# Patient Record
Sex: Male | Born: 1975
Health system: Southern US, Community
[De-identification: ages and names within clinical notes are randomized; demographics above are authoritative.]

## PROBLEM LIST (undated history)

## (undated) DIAGNOSIS — E669 Obesity, unspecified: Secondary | ICD-10-CM

## (undated) DIAGNOSIS — I10 Essential (primary) hypertension: Secondary | ICD-10-CM

## (undated) DIAGNOSIS — E785 Hyperlipidemia, unspecified: Secondary | ICD-10-CM

## (undated) HISTORY — PX: MOUTH SURGERY: SHX715

---

## 2017-10-16 DIAGNOSIS — J302 Other seasonal allergic rhinitis: Secondary | ICD-10-CM | POA: Insufficient documentation

## 2018-04-27 ENCOUNTER — Emergency Department (INDEPENDENT_AMBULATORY_CARE_PROVIDER_SITE_OTHER): Payer: Managed Care, Other (non HMO)

## 2018-04-27 ENCOUNTER — Emergency Department (INDEPENDENT_AMBULATORY_CARE_PROVIDER_SITE_OTHER)
Admission: EM | Admit: 2018-04-27 | Discharge: 2018-04-27 | Disposition: A | Discharge status: Home / Self Care | Payer: Managed Care, Other (non HMO) | Source: Home / Self Care | Attending: Family Medicine | Admitting: Family Medicine

## 2018-04-27 ENCOUNTER — Other Ambulatory Visit: Payer: Self-pay

## 2018-04-27 ENCOUNTER — Encounter: Payer: Self-pay | Admitting: Emergency Medicine

## 2018-04-27 DIAGNOSIS — R0602 Shortness of breath: Secondary | ICD-10-CM

## 2018-04-27 DIAGNOSIS — J029 Acute pharyngitis, unspecified: Secondary | ICD-10-CM

## 2018-04-27 DIAGNOSIS — R221 Localized swelling, mass and lump, neck: Secondary | ICD-10-CM

## 2018-04-27 DIAGNOSIS — R0981 Nasal congestion: Secondary | ICD-10-CM | POA: Diagnosis not present

## 2018-04-27 HISTORY — DX: Essential (primary) hypertension: I10

## 2018-04-27 MED ORDER — IPRATROPIUM BROMIDE 0.06 % NA SOLN: 2.0000 | Freq: Four times a day (QID) | NASAL | 1 refills | Status: DC

## 2018-04-27 MED ORDER — PREDNISONE 20 MG PO TABS: ORAL_TABLET | ORAL | 0 refills | Status: DC

## 2018-04-27 MED ORDER — CETIRIZINE HCL 10 MG PO TABS: 10.0000 mg | ORAL_TABLET | Freq: Every day | ORAL | 1 refills | Status: DC

## 2018-04-27 NOTE — ED Triage Notes (Signed)
Pt c/o of shortness of breath. States it's been present for about two weeks but has worsened. He had to leave work early today because he was unable to complete full sentences. He denies any other symptoms

## 2018-04-27 NOTE — ED Provider Notes (Signed)
[facility]    CSN: [id number] Arrival date & time: 04/27/18  1545     History   Chief Complaint Chief Complaint  Patient presents with  . Shortness of Breath    HPI Gregory Clark is a 42 y.o. male.   HPI  Gregory Clark is a 42 y.o. male presenting to UC with c/o intermittent SOB that has been going on for about 1 weeks now, worsened today while he was at work.  He had to leave work early because he was unable to fully complete a sentence. Per PMH pt was seen at the ED on 04/21/18 and dx with pneumonia and anxiety. He did complete the course of antibiotics and f/u with his PCP but pt does not believe the symptoms are from pneumonia or his anxiety. He states it feels like he cannot breath through his nose due to congestion but it also feels like hsi throat closes up at times. He feels like the Left side of his throat is sore and swollen. Denies fever, chills, n/v/d. He denies known thyroid problems but does not believe his PCP ever checked his thyroid. He does have f/u with ENT but not for another 3-4 weeks. Denies chest pain or SOB at this time.    Past Medical History:  Diagnosis Date  . Hypertension     There are no active problems to display for this patient.   Past Surgical History:  Procedure Laterality Date  . MOUTH SURGERY     plate in jaw       Home Medications    Prior to Admission medications   Medication Sig Start Date End Date Taking? Authorizing Provider  cetirizine (ZYRTEC) 10 MG tablet Take 1 tablet (10 mg total) by mouth daily. 04/27/18   Lurene Shadow, PA-C  ipratropium (ATROVENT) 0.06 % nasal spray Place 2 sprays into both nostrils 4 (four) times daily. 04/27/18   Lurene Shadow, PA-C  predniSONE (DELTASONE) 20 MG tablet 3 tabs po day one, then 2 po daily x 4 days 04/27/18   Lurene Shadow, PA-C    Family History History reviewed. No pertinent family history.  Social History Social History   Tobacco Use  . Smoking status: Never Smoker    . Smokeless tobacco: Never Used  Substance Use Topics  . Alcohol use: Not Currently  . Drug use: Not Currently     Allergies   Patient has no known allergies.   Review of Systems Review of Systems  Constitutional: Negative for chills and fever.  HENT: Positive for congestion, postnasal drip, rhinorrhea and sore throat. Negative for ear pain, trouble swallowing and voice change.   Respiratory: Positive for shortness of breath. Negative for cough.   Cardiovascular: Negative for chest pain and palpitations.  Gastrointestinal: Negative for abdominal pain, diarrhea, nausea and vomiting.  Musculoskeletal: Negative for arthralgias, back pain and myalgias.  Skin: Negative for rash.     Physical Exam Triage Vital Signs ED Triage Vitals [04/27/18 1613]  Enc Vitals Group     BP 121/80     Pulse Rate 76     Resp      Temp 98.7 F (37.1 C)     Temp Source Oral     SpO2 99 %     Weight 258 lb (117 kg)     Height 5\' 9"  (1.753 m)     Head Circumference      Peak Flow      Pain Score 0  Pain Loc      Pain Edu?      Excl. in GC?    No data found.  Updated Vital Signs BP 121/80 (BP Location: Right Arm)   Pulse 76   Temp 98.7 F (37.1 C) (Oral)   Ht 5\' 9"  (1.753 m)   Wt 258 lb (117 kg)   SpO2 99%   BMI 38.10 kg/m   Visual Acuity Right Eye Distance:   Left Eye Distance:   Bilateral Distance:    Right Eye Near:   Left Eye Near:    Bilateral Near:     Physical Exam  Constitutional: He is oriented to person, place, and time. He appears well-developed and well-nourished.  Non-toxic appearance. He does not appear ill. No distress.  HENT:  Head: Normocephalic and atraumatic.  Right Ear: Tympanic membrane normal.  Left Ear: Tympanic membrane normal.  Nose: Mucosal edema present. Right sinus exhibits no maxillary sinus tenderness and no frontal sinus tenderness. Left sinus exhibits no maxillary sinus tenderness and no frontal sinus tenderness.  Mouth/Throat: Uvula is  midline, oropharynx is clear and moist and mucous membranes are normal.  Eyes: EOM are normal.  Neck: Normal range of motion. Neck supple. No JVD present. No tracheal deviation present. No thyroid mass and no thyromegaly present.  Cardiovascular: Normal rate and regular rhythm.  Pulmonary/Chest: Effort normal and breath sounds normal. He has no decreased breath sounds. He has no wheezes. He has no rhonchi. He has no rales.  Musculoskeletal: Normal range of motion.  Lymphadenopathy:    He has cervical adenopathy (Left side, tender).  Neurological: He is alert and oriented to person, place, and time.  Skin: Skin is warm and dry.  Psychiatric: He has a normal mood and affect. His behavior is normal.  Nursing note and vitals reviewed.    UC Treatments / Results  Labs (all labs ordered are listed, but only abnormal results are displayed) Labs Reviewed  TSH  POCT CBC W AUTO DIFF ([facility])    EKG None  Radiology US Soft Tissue Head & Neck (non-thyroid)  Result Date: 04/30/2018 CLINICAL DATA:  Neck swelling. Sensation of throat swelling. Worse on the left. EXAM: ULTRASOUND OF HEAD/NECK SOFT TISSUES TECHNIQUE: Ultrasound examination of the head and neck soft tissues was performed in the area of clinical concern. COMPARISON:  None. FINDINGS: Sonographic evaluation of the neck at the location of swelling demonstrates enlarged bilateral neck nodes. Enlarged left neck node with a 1.0 cm short axis diameter. Enlarged right neck lymph node with a 1.1 cm short axis diameter. IMPRESSION: Sonographic evaluation of the neck reveals right and left abnormal adenopathy. Malignancy is not excluded. CT neck with contrast is recommended. Electronically Signed   By: [name] M.D.   On: 04/30/2018 14:37    Procedures Procedures (including critical care time)  Medications Ordered in UC Medications - No data to display  Initial Impression / Assessment and Plan / UC Course  I have reviewed  the triage vital signs and the nursing notes.  Pertinent labs & imaging results that were available during my care of the patient were reviewed by me and considered in my medical decision making (see chart for details).     Vitals: WNL including O2 Sat of 99% on RA Lungs: CTAB Pt notes his throat feels like it is closing at times. CBC: WNL TSH: Pending Thyroid U/S: ordered for pt to return later this week to [facility] for further evaluation   Final Clinical  Impressions(s) / UC Diagnoses   Final diagnoses:  Throat swelling  Nasal congestion  Shortness of breath  Sore throat     Discharge Instructions      The lab test for your thyroid has been ordered and should result in 2-3 days. You will be notified of the results And ultrasound for your neck has also been ordered. Please call the imaging department at [facility] to schedule an appointment this week to have that completed.  In the meantime, you may try the nasal spray and cetirizine for your congestion. If you are still not improving, you may try the prednisone to help with the inflammation sensation your throat.   Please follow up with family medicine in 1 week if not improving. Please call 911 or go to the hospital if symptoms worsening.     ED Prescriptions    Medication Sig Dispense Auth. Provider   ipratropium (ATROVENT) 0.06 % nasal spray Place 2 sprays into both nostrils 4 (four) times daily. 15 mL Doroteo Glassman, Oveda Dadamo O, PA-C   cetirizine (ZYRTEC) 10 MG tablet Take 1 tablet (10 mg total) by mouth daily. 30 tablet Doroteo Glassman, Selina Tapper O, PA-C   predniSONE (DELTASONE) 20 MG tablet 3 tabs po day one, then 2 po daily x 4 days 11 tablet Lurene Shadow, PA-C     Controlled Substance Prescriptions [place] Controlled Substance Registry consulted? Not Applicable   Rolla Plate 05/01/18 1610

## 2018-04-27 NOTE — Discharge Instructions (Addendum)
°  The lab test for your thyroid has been ordered and should result in 2-3 days. You will be notified of the results And ultrasound for your neck has also been ordered. Please call the imaging department at [facility] to schedule an appointment this week to have that completed.  In the meantime, you may try the nasal spray and cetirizine for your congestion. If you are still not improving, you may try the prednisone to help with the inflammation sensation your throat.   Please follow up with family medicine in 1 week if not improving. Please call 911 or go to the hospital if symptoms worsening.

## 2018-04-28 LAB — TSH: TSH: 1.35 mIU/L (ref 0.40–4.50)

## 2018-04-29 ENCOUNTER — Telehealth: Payer: Self-pay | Admitting: Emergency Medicine

## 2018-04-29 NOTE — Telephone Encounter (Signed)
Labs normal, hope he starts feeling better soon

## 2018-04-30 ENCOUNTER — Ambulatory Visit (INDEPENDENT_AMBULATORY_CARE_PROVIDER_SITE_OTHER): Payer: Managed Care, Other (non HMO)

## 2018-04-30 ENCOUNTER — Telehealth: Payer: Self-pay

## 2018-04-30 DIAGNOSIS — R221 Localized swelling, mass and lump, neck: Secondary | ICD-10-CM

## 2018-04-30 DIAGNOSIS — R590 Localized enlarged lymph nodes: Secondary | ICD-10-CM | POA: Diagnosis not present

## 2018-04-30 NOTE — Telephone Encounter (Signed)
Pt called requesting extra day off work note due to illness. Left for front to pick up.

## 2018-05-01 ENCOUNTER — Telehealth: Payer: Self-pay | Admitting: Emergency Medicine

## 2018-05-04 ENCOUNTER — Encounter: Payer: Self-pay | Admitting: Emergency Medicine

## 2018-05-04 ENCOUNTER — Emergency Department (INDEPENDENT_AMBULATORY_CARE_PROVIDER_SITE_OTHER)
Admission: EM | Admit: 2018-05-04 | Discharge: 2018-05-04 | Disposition: A | Discharge status: Home / Self Care | Payer: Managed Care, Other (non HMO) | Source: Home / Self Care | Attending: Emergency Medicine | Admitting: Emergency Medicine

## 2018-05-04 ENCOUNTER — Other Ambulatory Visit: Payer: Self-pay

## 2018-05-04 DIAGNOSIS — R4702 Dysphasia: Secondary | ICD-10-CM

## 2018-05-04 DIAGNOSIS — F419 Anxiety disorder, unspecified: Secondary | ICD-10-CM | POA: Diagnosis not present

## 2018-05-04 MED ORDER — ALPRAZOLAM 0.25 MG PO TABS: ORAL_TABLET | ORAL | 0 refills | Status: DC

## 2018-05-04 NOTE — ED Provider Notes (Addendum)
[facility]    CSN: [id number] Arrival date & time: 05/04/18  1407     History   Chief Complaint Chief Complaint  Patient presents with  . Panic Attack  Chief complaint: Anxiety and multiple symptoms.  Denies acute panic attack currently.  HPI Gregory Clark is a 42 y.o. male.  Complicated history.  Patient presents to [facility] urgent care on Saturday afternoon 05/04/2018 . His chief complaint are episodic feelings of anxiety and globus sensation, dysphasia, left neck discomfort, which are worsening the past 10 days or so, and he feels this is from anxiety and he requests a short-term anxiety medication, a note to excuse from work, until he can follow-up with his new PCP, Dr. [name], new patient appointment there 05/09/2018 and he also has ENT appointment on 05/07/2018 to evaluate vague neck symptoms and reportedly abnormal imaging test on neck. Last week, he was briefly taking lorazepam 1 mg daily, and he felt that did not significantly help anxiety.  Currently on Lexapro 20 mg daily that was previously started by his prior PCP, [name].  Patient states that he is not satisfied with [name], and therefore he made an appointment to establish with new PCP Dr. [name], new patient appt there 05/09/2018.   Currently, he denies chest pain or shortness of breath or problems swallowing or breathing at this moment. He feels mildly anxious but denies hallucinations or suicidal or homicidal ideation. Denies fever or chills.  Denies syncope or focal neurologic symptoms.  Denies abdominal pain or nausea or vomiting.  Reviewing some records in care everywhere: Chest x-ray at [facility] emergency room (unrelated to our facility) on 04/21/2018 was read by radiologist as "no pneumothorax.  Patchy lingular/right middle lobe infiltrate or atelectasis".  He was treated there with antibiotic.  Currently, denies shortness of breath or cough or chest congestion or  fever.  CBC on 04/21/2018 was within normal limits.  Troponin negative.  BMP normal except CO2 low at 16, consistent with hyperventilation.  Troponin level, CK, CK-MB, and D-dimer was within normal limits. CMP was otherwise within normal limits.  Was seen here by a different provider for globus symptoms and feeling of throat swelling and pain on 04/27/2018 . X-ray of neck soft tissues showed: Per radiologist : "IMPRESSION: No acute radiographic abnormality in the neck soft tissues. Electronically Signed   By: Delbert Phenix M.D.   On: 04/27/2018 17:49"  Ultrasound radiologist report:: ULTRASOUND OF HEAD/NECK SOFT TISSUES TECHNIQUE: Ultrasound examination of the head and neck soft tissues was performed in the area of clinical concern. COMPARISON:  None. FINDINGS:  Sonographic evaluation of the neck at the location of swelling demonstrates enlarged bilateral neck nodes. Enlarged left neck node with a 1.0 cm short axis diameter. Enlarged right neck lymph node with a 1.1 cm short axis diameter. IMPRESSION: Sonographic evaluation of the neck reveals right and left abnormal adenopathy. Malignancy is not excluded. CT neck with contrast is recommended. Electronically Signed   By: [name] M.D.     On: 04/30/2018 14:37   HPI  Past Medical History:  Diagnosis Date  . Hypertension     There are no active problems to display for this patient.   Past Surgical History:  Procedure Laterality Date  . MOUTH SURGERY     plate in jaw       Home Medications    Prior to Admission medications   Medication Sig Start Date End Date Taking? Authorizing Provider  ALPRAZolam (XANAX) 0.25 MG tablet Take 1 every 12 hours if needed for anxiety 05/04/18   [name], David, MD    Family History No family history on file.  Social History Social History   Tobacco Use  . Smoking status: Never Smoker  . Smokeless tobacco: Never Used  Substance Use Topics  . Alcohol use: Not Currently  .  Drug use: Not Currently   Denies smoking cigarettes, using alcohol, or drugs.  Allergies   Patient has no known allergies.   Review of Systems Review of Systems  Constitutional: Positive for appetite change. Negative for chills and fever.  HENT: Positive for trouble swallowing. Negative for congestion, ear pain, rhinorrhea and sore throat.   Eyes: Negative for visual disturbance.  Respiratory: Negative for cough and shortness of breath.   Cardiovascular: Negative for chest pain, palpitations and leg swelling.  Gastrointestinal: Negative for abdominal pain, nausea and vomiting.  Genitourinary: Negative.   Musculoskeletal: Negative for joint swelling.  Neurological: Negative for seizures and syncope.  Psychiatric/Behavioral: Positive for dysphoric mood. Negative for hallucinations, self-injury and suicidal ideas. The patient is nervous/anxious.   All other systems reviewed and are negative.    Physical Exam Triage Vital Signs ED Triage Vitals  Enc Vitals Group     BP 05/04/18 1454 113/75     Pulse Rate 05/04/18 1454 96     Resp 05/04/18 1454 18     Temp 05/04/18 1454 98.7 F (37.1 C)     Temp Source 05/04/18 1454 Oral     SpO2 05/04/18 1454 97 %     Weight 05/04/18 1456 258 lb (117 kg)     Height 05/04/18 1456 5\' 9"  (1.753 m)     Head Circumference --      Peak Flow --      Pain Score 05/04/18 1456 0     Pain Loc --      Pain Edu? --      Excl. in GC? --    No data found.  Updated Vital Signs BP 113/75 (BP Location: Right Arm)   Pulse 96   Temp 98.7 F (37.1 C) (Oral)   Resp 18   Ht 5\' 9"  (1.753 m)   Wt 117 kg   SpO2 97%   BMI 38.10 kg/m    Physical Exam  Constitutional: He is oriented to person, place, and time. He appears well-developed and well-nourished. No distress.  HENT:  Head: Normocephalic and atraumatic.  Right Ear: External ear normal.  Left Ear: External ear normal.  Nose: Nose normal.  Mouth/Throat: Oropharynx is clear and moist. No  oropharyngeal exudate.  Eyes: Conjunctivae are normal.  Neck: Neck supple. No JVD present. No tracheal deviation present.  Mildly tender bilateral 1 x 1 cm shotty, rubbery, movable anterior cervical lymph nodes. No thyromegaly or any other masses felt.  Cardiovascular: Normal rate, regular rhythm and normal heart sounds. Exam reveals no gallop and no friction rub.  No murmur heard. Pulmonary/Chest: Effort normal and breath sounds normal. No stridor. No respiratory distress. He has no wheezes. He has no rales.  Abdominal: Soft. He exhibits no distension. There is no tenderness.  Musculoskeletal: He exhibits no deformity.  Neurological: He is alert and oriented to person, place, and time. No cranial nerve deficit.  Skin: Capillary refill takes less than 2 seconds. No rash noted. He is not diaphoretic.  Psychiatric:  Mildly anxious  Nursing note and vitals reviewed.    UC Treatments / Results  Labs (all labs ordered  are listed, but only abnormal results are displayed) Labs Reviewed - No data to display  EKG None  Radiology No results found.  Procedures Procedures (including critical care time)  Medications Ordered in UC Medications - No data to display  Initial Impression / Assessment and Plan / UC Course  I have reviewed the triage vital signs and the nursing notes.  Pertinent labs & imaging results that were available during my care of the patient were reviewed by me and considered in my medical decision making (see chart for details).     Discussed with patient that he needs to keep follow-up appointments with ENT and PCP. Explained at length that the abnormal neck ultrasound needs further evaluation by ENT,, as this might or might not be a sign of malignancy.  I explained risks of not following up with ENT on 9/17 appointment and those risks of not following up include death.  ENT to manage work-up and treatment.  Also discussed that he has symptoms of globus with  dysphasia and anxiety symptoms and I am agreeable to right short-term prescription for Xanax but we cannot refill that in the future. At his request, I wrote short-term work excuse, excusing from work 9/12-9/14/2019, but we cannot provide any further work notes or FMLA form completion.-I advised him that this would need to be addressed by his PCP and/or ENT and/or any other specialist.  An After Visit Summary was printed and given to the patient.  Questions invited and answered. He voiced understanding and agreement with plan as described above and below in discharge instructions. Red Flags discussed. The patient was given clear instructions to go to ER  if any red flags develop. He verbalized understanding.   Final Clinical Impressions(s) / UC Diagnoses   Final diagnoses:  Anxiety  Dysphasia  Over 40 minutes spent, greater than 50% of the time spent for counseling and coordination of care.   Discharge Instructions     Continue taking Lexapro 10 mg daily as previously prescribed by another physician. New prescription for the short-term for Xanax 0.25 mg, 1 daily or twice a day if needed for anxiety. Note for work printed out, excusing from work 9/12 through 05/04/2018. Keep 05/09/2018 appointment with Dr. [name], your new PCP.-(Your new PCP would need to further evaluate and treat your symptoms and any further work notes would need to be written by your new PCP) Keep appointment on 05/07/2018 with ENT for evaluation of neck discomfort and history of thyroid nodules seen on ultrasound. Please see attached handout regarding panic disorder.    ED Prescriptions    Medication Sig Dispense Auth. Provider   ALPRAZolam Prudy Feeler) 0.25 MG tablet Take 1 every 12 hours if needed for anxiety 10 tablet Lajean Manes, MD     Controlled Substance Prescriptions [place] Controlled Substance Registry consulted? Yes, I have consulted the [place] Controlled Substances Registry for this patient, and feel the  risk/benefit ratio today is favorable for proceeding with this prescription for a controlled substance.   Lajean Manes, MD 05/06/18 Rufina Falco    Lajean Manes, MD 05/06/18 [phone]

## 2018-05-04 NOTE — ED Triage Notes (Signed)
Patient has been trying to deal with increasing frequency and severity of anxiety attacks; difficult sleeping, eating, and now work absenteeism. Has appt with new PCP in one week but wants interim help.

## 2018-05-04 NOTE — Discharge Instructions (Addendum)
Continue taking Lexapro 10 mg daily as previously prescribed by another physician. New prescription for the short-term for Xanax 0.25 mg, 1 daily or twice a day if needed for anxiety. Note for work printed out, excusing from work 9/12 through 05/04/2018. Keep 05/09/2018 appointment with Dr. Lorriane ShireEric Gavor, your new PCP.-(Your new PCP would need to further evaluate and treat your symptoms and any further work notes would need to be written by your new PCP) Keep appointment on 05/07/2018 with ENT for evaluation of neck discomfort and history of thyroid nodules seen on ultrasound. Please see attached handout regarding panic disorder.

## 2018-05-08 NOTE — Telephone Encounter (Signed)
Just following up to make sure he was evaluated by ENT, give us a call [place] when you can. [phone].

## 2018-05-09 DIAGNOSIS — F411 Generalized anxiety disorder: Secondary | ICD-10-CM | POA: Insufficient documentation

## 2018-05-14 DIAGNOSIS — E782 Mixed hyperlipidemia: Secondary | ICD-10-CM | POA: Insufficient documentation

## 2018-07-06 ENCOUNTER — Other Ambulatory Visit: Payer: Self-pay

## 2018-07-06 ENCOUNTER — Emergency Department (INDEPENDENT_AMBULATORY_CARE_PROVIDER_SITE_OTHER)
Admission: EM | Admit: 2018-07-06 | Discharge: 2018-07-06 | Disposition: A | Discharge status: Home / Self Care | Payer: Managed Care, Other (non HMO) | Source: Home / Self Care | Attending: Family Medicine | Admitting: Family Medicine

## 2018-07-06 DIAGNOSIS — J00 Acute nasopharyngitis [common cold]: Secondary | ICD-10-CM | POA: Diagnosis not present

## 2018-07-06 MED ORDER — IPRATROPIUM BROMIDE 0.06 % NA SOLN: 2.0000 | Freq: Four times a day (QID) | NASAL | 1 refills | Status: DC

## 2018-07-06 MED ORDER — BENZONATATE 100 MG PO CAPS: 100.0000 mg | ORAL_CAPSULE | Freq: Three times a day (TID) | ORAL | 0 refills | Status: DC

## 2018-07-06 NOTE — ED Provider Notes (Signed)
[facility]    CSN: [id number] Arrival date & time: 07/06/18  1333     History   Chief Complaint Chief Complaint  Patient presents with  . Sore Throat  . Nasal Congestion  . Shortness of Breath    HPI Gregory Clark is a 42 y.o. male.   HPI Gregory Clark is a 42 y.o. male presenting to UC with c/o 2 days of cough, nasal congestion, and rhinorrhea.  He has taken Nyquil and Alka-seltzer cold w/o Clark relief.  He reports being scheduled for a nasal surgery for polyps on 08/09/18.  He denies fever, chills, n/v/d. He is requesting a work note.    Past Medical History:  Diagnosis Date  . Hypertension     There are no active problems to display for this patient.   Past Surgical History:  Procedure Laterality Date  . MOUTH SURGERY     plate in jaw       Home Medications    Prior to Admission medications   Medication Sig Start Date End Date Taking? Authorizing Provider  benzonatate (TESSALON) 100 MG capsule Take 1-2 capsules (100-200 mg total) by mouth every 8 (eight) hours. 07/06/18   Lurene ShadowPhelps, Adamariz Gillott O, PA-C  ipratropium (ATROVENT) 0.06 % nasal spray Place 2 sprays into both nostrils 4 (four) times daily. 07/06/18   Lurene ShadowPhelps, Velmer Broadfoot O, PA-C    Family History History reviewed. No pertinent family history.  Social History Social History   Tobacco Use  . Smoking status: Never Smoker  . Smokeless tobacco: Never Used  Substance Use Topics  . Alcohol use: Not Currently  . Drug use: Not Currently     Allergies   Patient has no known allergies.   Review of Systems Review of Systems  Constitutional: Negative for chills and fever.  HENT: Positive for congestion, postnasal drip, rhinorrhea and sore throat ( scratchy). Negative for ear pain, trouble swallowing and voice change.   Respiratory: Positive for cough. Negative for shortness of breath.   Cardiovascular: Negative for chest pain and palpitations.  Gastrointestinal: Negative for abdominal pain,  diarrhea, nausea and vomiting.  Musculoskeletal: Negative for arthralgias, back pain and myalgias.  Skin: Negative for rash.  Neurological: Negative for dizziness, light-headedness and headaches.     Physical Exam Triage Vital Signs ED Triage Vitals  Enc Vitals Group     BP 07/06/18 1351 129/83     Pulse Rate 07/06/18 1351 88     Resp 07/06/18 1351 20     Temp 07/06/18 1351 98 F (36.7 C)     Temp Source 07/06/18 1351 Oral     SpO2 07/06/18 1351 96 %     Weight 07/06/18 1352 248 lb (112.5 kg)     Height 07/06/18 1352 5\' 8"  (1.727 m)     Head Circumference --      Peak Flow --      Pain Score 07/06/18 1352 0     Pain Loc --      Pain Edu? --      Excl. in GC? --    No data found.  Updated Vital Signs BP 129/83 (BP Location: Right Arm)   Pulse 88   Temp 98 F (36.7 C) (Oral)   Resp 20   Ht 5\' 8"  (1.727 m)   Wt 248 lb (112.5 kg)   SpO2 96%   BMI 37.71 kg/m   Visual Acuity Right Eye Distance:   Left Eye Distance:   Bilateral Distance:    Right  Eye Near:   Left Eye Near:    Bilateral Near:     Physical Exam  Constitutional: He is oriented to person, place, and time. He appears well-developed and well-nourished.  HENT:  Head: Normocephalic and atraumatic.  Right Ear: Tympanic membrane normal.  Left Ear: Tympanic membrane normal.  Nose: Nose normal.  Mouth/Throat: Uvula is midline, oropharynx is clear and moist and mucous membranes are normal.  Eyes: EOM are normal.  Neck: Normal range of motion. Neck supple.  Cardiovascular: Normal rate and regular rhythm.  Pulmonary/Chest: Effort normal and breath sounds normal. No stridor. No respiratory distress. He has no wheezes. He has no rhonchi.  Musculoskeletal: Normal range of motion.  Lymphadenopathy:    He has no cervical adenopathy.  Neurological: He is alert and oriented to person, place, and time.  Skin: Skin is warm and dry.  Psychiatric: He has a normal mood and affect. His behavior is normal.  Nursing  note and vitals reviewed.    UC Treatments / Results  Labs (all labs ordered are listed, but only abnormal results are displayed) Labs Reviewed - No data to display  EKG None  Radiology No results found.  Procedures Procedures (including critical care time)  Medications Ordered in UC Medications - No data to display  Initial Impression / Assessment and Plan / UC Course  I have reviewed the triage vital signs and the nursing notes.  Pertinent labs & imaging results that were available during my care of the patient were reviewed by me and considered in my medical decision making (see chart for details).     Hx and exam c/w common cold virus. No evidence of bacterial infection at this time. Home care instructions provided. 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  Final Clinical Impressions(s) / UC Diagnoses   Final diagnoses:  Common cold     Discharge Instructions      Please follow up with family medicine in 1 week if needed.     ED Prescriptions    Medication Sig Dispense Auth. Provider   benzonatate (TESSALON) 100 MG capsule Take 1-2 capsules (100-200 mg total) by mouth every 8 (eight) hours. 21 capsule Doroteo Glassman, Kambrey Hagger O, PA-C   ipratropium (ATROVENT) 0.06 % nasal spray Place 2 sprays into both nostrils 4 (four) times daily. 15 mL Lurene Shadow, PA-C     Controlled Substance Prescriptions [place] Controlled Substance Registry consulted? Not Applicable   Rolla Plate 07/06/18 1445

## 2018-07-06 NOTE — ED Triage Notes (Signed)
Mr. Gregory Clark presents with cold like symptoms; sore throat, nasal congestion, rhinorrhea, and cough x2 days. He has taken nyquil and alka-seltzer cold otc without any significant improvement. VS wdl. He has not had a fever and has had his Influenza immunization this season.

## 2018-07-06 NOTE — Discharge Instructions (Signed)
°  Please follow up with family medicine in 1 week if needed.

## 2018-08-23 ENCOUNTER — Other Ambulatory Visit: Payer: Self-pay

## 2018-08-23 ENCOUNTER — Encounter: Payer: Self-pay | Admitting: Emergency Medicine

## 2018-08-23 ENCOUNTER — Emergency Department (INDEPENDENT_AMBULATORY_CARE_PROVIDER_SITE_OTHER)
Admission: EM | Admit: 2018-08-23 | Discharge: 2018-08-23 | Disposition: A | Discharge status: Home / Self Care | Payer: BLUE CROSS/BLUE SHIELD | Source: Home / Self Care | Attending: Emergency Medicine | Admitting: Emergency Medicine

## 2018-08-23 DIAGNOSIS — J029 Acute pharyngitis, unspecified: Secondary | ICD-10-CM | POA: Diagnosis not present

## 2018-08-23 DIAGNOSIS — R0981 Nasal congestion: Secondary | ICD-10-CM

## 2018-08-23 HISTORY — DX: Hyperlipidemia, unspecified: E78.5

## 2018-08-23 MED ORDER — AMOXICILLIN 875 MG PO TABS: 875.0000 mg | ORAL_TABLET | Freq: Two times a day (BID) | ORAL | 0 refills | Status: DC

## 2018-08-23 NOTE — ED Triage Notes (Signed)
Patient had Rhinoplasty on December 20th and is having difficulty breathing through his nose. He called his surgeon and was told to do saline rinses and come to his office on Tuesday January 7th, he can here for help until then.

## 2018-08-23 NOTE — Discharge Instructions (Signed)
Take antibiotics as instructed. Follow-up with your ear nose and throat doctor on Tuesday.

## 2018-08-23 NOTE — ED Provider Notes (Signed)
[facility]    CSN: [id number] Arrival date & time: 08/23/18  1014     History   Chief Complaint Chief Complaint  Patient presents with  . breathing issue    HPI Gregory Clark is a 43 y.o. male.   HPI Patient enters with difficulty breathing.  On December 20 patient underwent a septoplasty and is here today because he feels a sensation on the left side of his throat which intermittently causes some difficulty with breathing.  He states that air moves freely through his nose.  He has not had any stridor.  He has not had any cough.  He feels like the glands on the left side of his neck are swollen. Past Medical History:  Diagnosis Date  . Hyperlipidemia   . Hypertension     There are no active problems to display for this patient.   Past Surgical History:  Procedure Laterality Date  . MOUTH SURGERY     plate in jaw       Home Medications    Prior to Admission medications   Medication Sig Start Date End Date Taking? Authorizing Provider  sodium chloride (OCEAN) 0.65 % SOLN nasal spray Place 1 spray into both nostrils as needed for congestion.   Yes [provider]  amoxicillin (AMOXIL) 875 MG tablet Take 1 tablet (875 mg total) by mouth 2 (two) times daily. 08/23/18   Collene Gobbleaub, Alizea Pell A, MD    Family History History reviewed. No pertinent family history.  Social History Social History   Tobacco Use  . Smoking status: Never Smoker  . Smokeless tobacco: Never Used  Substance Use Topics  . Alcohol use: Not Currently  . Drug use: Not Currently     Allergies   Patient has no known allergies.   Review of Systems Review of Systems  Constitutional: Negative.   HENT: Positive for congestion, postnasal drip and sore throat.   Eyes: Negative.   Respiratory: Positive for cough.   Cardiovascular: Negative.   Gastrointestinal: Negative.   Endocrine: Negative.   Genitourinary: Negative.      Physical Exam Triage Vital Signs ED Triage  Vitals  Enc Vitals Group     BP 08/23/18 1038 133/85     Pulse Rate 08/23/18 1038 96     Resp --      Temp 08/23/18 1038 98.4 F (36.9 C)     Temp Source 08/23/18 1038 Oral     SpO2 08/23/18 1038 97 %     Weight 08/23/18 1039 248 lb (112.5 kg)     Height 08/23/18 1039 5\' 9"  (1.753 m)     Head Circumference --      Peak Flow --      Pain Score 08/23/18 1038 2     Pain Loc --      Pain Edu? --      Excl. in GC? --    No data found.  Updated Vital Signs BP 133/85 (BP Location: Right Arm)   Pulse 96   Temp 98.4 F (36.9 C) (Oral)   Ht 5\' 9"  (1.753 m)   Wt 112.5 kg   SpO2 97%   BMI 36.62 kg/m   Visual Acuity Right Eye Distance:   Left Eye Distance:   Bilateral Distance:    Right Eye Near:   Left Eye Near:    Bilateral Near:     Physical Exam Constitutional:      Appearance: Normal appearance.  HENT:     Head: Normocephalic.  Right Ear: Tympanic membrane normal.     Left Ear: Tympanic membrane normal.     Nose:     Comments: There is some redness of the turbinates with dried blood.    Mouth/Throat:     Mouth: Mucous membranes are dry.     Pharynx: Oropharynx is clear. No oropharyngeal exudate or posterior oropharyngeal erythema.  Neck:     Musculoskeletal: Normal range of motion.     Comments: There is a tender left anterior cervical node. Neurological:     Mental Status: He is alert.      UC Treatments / Results  Labs (all labs ordered are listed, but only abnormal results are displayed) Labs Reviewed - No data to display  EKG None  Radiology No results found.  Procedures Procedures (including critical care time)  Medications Ordered in UC Medications - No data to display  Initial Impression / Assessment and Plan / UC Course  I have reviewed the triage vital signs and the nursing notes.  Pertinent labs & imaging results that were available during my care of the patient were reviewed by me and considered in my medical decision making (see  chart for details). Patient unable to tolerate having a strep test.  Will cover with amoxicillin for 5 days that we will give him time to follow-up with his ear nose and throat doctor on Tuesday.  We attempted to do a strep test but this was unsuccessful.  Patient cannot open his mouth for the testing.     Final Clinical Impressions(s) / UC Diagnoses   Final diagnoses:  Acute sore throat  Nasal congestion     Discharge Instructions     Take antibiotics as instructed. Follow-up with your ear nose and throat doctor on Tuesday.     ED Prescriptions    Medication Sig Dispense Auth. Provider   amoxicillin (AMOXIL) 875 MG tablet Take 1 tablet (875 mg total) by mouth 2 (two) times daily. 10 tablet Collene Gobbleaub, Jahnasia Tatum A, MD     Controlled Substance Prescriptions [place] Controlled Substance Registry consulted? Not Applicable   Collene Gobbleaub, Karyme Mcconathy A, MD 08/23/18 1434

## 2018-08-31 ENCOUNTER — Emergency Department (INDEPENDENT_AMBULATORY_CARE_PROVIDER_SITE_OTHER)
Admission: EM | Admit: 2018-08-31 | Discharge: 2018-08-31 | Disposition: A | Discharge status: Home / Self Care | Payer: BLUE CROSS/BLUE SHIELD | Source: Home / Self Care | Attending: Family Medicine | Admitting: Family Medicine

## 2018-08-31 ENCOUNTER — Encounter: Payer: Self-pay | Admitting: Emergency Medicine

## 2018-08-31 DIAGNOSIS — J069 Acute upper respiratory infection, unspecified: Secondary | ICD-10-CM | POA: Diagnosis not present

## 2018-08-31 MED ORDER — PREDNISONE 20 MG PO TABS: ORAL_TABLET | ORAL | 0 refills | Status: DC

## 2018-08-31 MED ORDER — AZITHROMYCIN 250 MG PO TABS: 250.0000 mg | ORAL_TABLET | Freq: Every day | ORAL | 0 refills | Status: DC

## 2018-08-31 NOTE — Discharge Instructions (Signed)

## 2018-08-31 NOTE — ED Triage Notes (Signed)
Patient c/o non-productive cough x 4 days, can't sleep, chest discomfort, no ear pain, fever x 1 day, taking Tylenol.

## 2018-08-31 NOTE — ED Provider Notes (Signed)
[name]    CSN: [id number] Arrival date & time: 08/31/18  1322     History   Chief Complaint Chief Complaint  Patient presents with  . Cough    HPI Gregory Clark is a 43 y.o. male.   HPI  Gregory Clark is a 43 y.o. male presenting to UC with c/o intermittent non-productive cough that keeps him up at night, worse over the last 4 days.  Associated chest discomfort and low-grade fever since yesterday. He had sinus surgery on 08/09/18. He had f/u with his ENT on 08/19/18, advised everything was looking good.  He was advised to continue using sinus rinses. He was seen at [facility] on 08/23/2018 for nasal congestion and sore throat. He completed a course of amoxicillin 4 days ago but states he is still having chest congestion and discomfort with breathing at times.  He has not called his ENT or PCP for f/u.   Past Medical History:  Diagnosis Date  . Hyperlipidemia   . Hypertension     There are no active problems to display for this patient.   Past Surgical History:  Procedure Laterality Date  . MOUTH SURGERY     plate in jaw       Home Medications    Prior to Admission medications   Medication Sig Start Date End Date Taking? Authorizing Provider  azithromycin (ZITHROMAX) 250 MG tablet Take 1 tablet (250 mg total) by mouth daily. Take first 2 tablets together, then 1 every day until finished. 08/31/18   Lurene Shadow, PA-C  predniSONE (DELTASONE) 20 MG tablet 3 tabs po day one, then 2 po daily x 4 days 08/31/18   Lurene Shadow, PA-C    Family History No family history on file.  Social History Social History   Tobacco Use  . Smoking status: Never Smoker  . Smokeless tobacco: Never Used  Substance Use Topics  . Alcohol use: Not Currently  . Drug use: Not Currently     Allergies   Patient has no known allergies.   Review of Systems Review of Systems  Constitutional: Positive for fever (low-grade). Negative for chills.  HENT: Positive for  congestion, postnasal drip, rhinorrhea, sinus pressure and sore throat. Negative for ear pain, sinus pain, trouble swallowing and voice change.   Respiratory: Positive for cough, chest tightness and shortness of breath. Negative for wheezing and stridor.   Cardiovascular: Negative for chest pain and palpitations.  Gastrointestinal: Negative for abdominal pain, diarrhea, nausea and vomiting.  Musculoskeletal: Negative for arthralgias, back pain and myalgias.  Skin: Negative for rash.     Physical Exam Triage Vital Signs ED Triage Vitals [08/31/18 1334]  Enc Vitals Group     BP 113/78     Pulse Rate 88     Resp      Temp 98.3 F (36.8 C)     Temp Source Oral     SpO2 99 %     Weight 246 lb 8 oz (111.8 kg)     Height 5\' 9"  (1.753 m)     Head Circumference      Peak Flow      Pain Score 0     Pain Loc      Pain Edu?      Excl. in GC?    No data found.  Updated Vital Signs BP 113/78 (BP Location: Right Arm)   Pulse 88   Temp 98.3 F (36.8 C) (Oral)   Ht 5\' 9"  (1.753 m)  Wt 246 lb 8 oz (111.8 kg)   SpO2 99%   BMI 36.40 kg/m   Visual Acuity Right Eye Distance:   Left Eye Distance:   Bilateral Distance:    Right Eye Near:   Left Eye Near:    Bilateral Near:     Physical Exam Vitals signs and nursing note reviewed.  Constitutional:      Appearance: Normal appearance. He is well-developed.  HENT:     Head: Normocephalic and atraumatic.     Right Ear: Tympanic membrane normal.     Left Ear: Tympanic membrane normal.     Nose: Congestion present.     Right Sinus: No maxillary sinus tenderness or frontal sinus tenderness.     Left Sinus: No maxillary sinus tenderness or frontal sinus tenderness.     Mouth/Throat:     Lips: Pink.     Mouth: Mucous membranes are moist.     Pharynx: Oropharynx is clear. Uvula midline. No pharyngeal swelling, oropharyngeal exudate, posterior oropharyngeal erythema or uvula swelling.  Neck:     Musculoskeletal: Normal range of  motion.  Cardiovascular:     Rate and Rhythm: Normal rate and regular rhythm.  Pulmonary:     Effort: Pulmonary effort is normal. No respiratory distress.     Breath sounds: Normal breath sounds. No stridor. No wheezing or rhonchi.  Musculoskeletal: Normal range of motion.  Skin:    General: Skin is warm and dry.  Neurological:     Mental Status: He is alert and oriented to person, place, and time.  Psychiatric:        Behavior: Behavior normal.      UC Treatments / Results  Labs (all labs ordered are listed, but only abnormal results are displayed) Labs Reviewed - No data to display  EKG None  Radiology No results found.  Procedures Procedures (including critical care time)  Medications Ordered in UC Medications - No data to display  Initial Impression / Assessment and Plan / UC Course  I have reviewed the triage vital signs and the nursing notes.  Pertinent labs & imaging results that were available during my care of the patient were reviewed by me and considered in my medical decision making (see chart for details).     Cough could be from post-nasal drainage from recent surgery. Will cover for atypical bacteria given at least 2 weeks duration of cough.  Encouraged f/u with PCP and ENT if not improving.  Final Clinical Impressions(s) / UC Diagnoses   Final diagnoses:  Upper respiratory tract infection, unspecified type     Discharge Instructions      Please take antibiotics as prescribed and be sure to complete entire course even if you start to feel better to ensure infection does not come back.  You may take 500mg  acetaminophen every 4-6 hours or in combination with ibuprofen 400-600mg  every 6-8 hours as needed for pain, inflammation, and fever.  Be sure to well hydrated with clear liquids and get at least 8 hours of sleep at night, preferably more while sick.   Please follow up with family medicine in 1 week if needed.     ED Prescriptions     Medication Sig Dispense Auth. Provider   azithromycin (ZITHROMAX) 250 MG tablet Take 1 tablet (250 mg total) by mouth daily. Take first 2 tablets together, then 1 every day until finished. 6 tablet Doroteo GlassmanPhelps, Jazmynn Pho O, PA-C   predniSONE (DELTASONE) 20 MG tablet 3 tabs po day one, then 2  po daily x 4 days 11 tablet Lurene Shadow, [place]     Controlled Substance Prescriptions [place] Controlled Substance Registry consulted? Not Applicable   Rolla Plate 08/31/18 1419

## 2018-09-09 ENCOUNTER — Telehealth: Payer: Self-pay

## 2018-09-09 NOTE — Telephone Encounter (Signed)
Pt called to come in to fill out FMLA paperwork.

## 2018-10-03 ENCOUNTER — Emergency Department (INDEPENDENT_AMBULATORY_CARE_PROVIDER_SITE_OTHER)
Admission: EM | Admit: 2018-10-03 | Discharge: 2018-10-03 | Disposition: A | Discharge status: Home / Self Care | Payer: BLUE CROSS/BLUE SHIELD | Source: Home / Self Care

## 2018-10-03 ENCOUNTER — Other Ambulatory Visit: Payer: Self-pay

## 2018-10-03 ENCOUNTER — Encounter: Payer: Self-pay | Admitting: *Deleted

## 2018-10-03 DIAGNOSIS — J209 Acute bronchitis, unspecified: Secondary | ICD-10-CM | POA: Diagnosis not present

## 2018-10-03 MED ORDER — ALBUTEROL SULFATE HFA 108 (90 BASE) MCG/ACT IN AERS: 1.0000 | INHALATION_SPRAY | Freq: Four times a day (QID) | RESPIRATORY_TRACT | 0 refills | Status: DC | PRN

## 2018-10-03 MED ORDER — DOXYCYCLINE HYCLATE 100 MG PO CAPS: 100.0000 mg | ORAL_CAPSULE | Freq: Two times a day (BID) | ORAL | 0 refills | Status: DC

## 2018-10-03 NOTE — ED Triage Notes (Signed)
Pt c/o nonproductive cough x 3 days. He is taking Dayquil and Nyquil.

## 2018-10-03 NOTE — ED Provider Notes (Signed)
[facility]    CSN: [id number] Arrival date & time: 10/03/18  [id number]     History   Chief Complaint Chief Complaint  Patient presents with  . Cough    HPI Gregory Clark is a 43 y.o. male.   The history is provided by the patient.  Cough  Cough characteristics:  Non-productive Sputum characteristics:  Nondescript Severity:  Moderate Onset quality:  Gradual Duration:  1 week Timing:  Constant Progression:  Worsening Chronicity:  New Smoker: no   Relieved by:  Nothing Worsened by:  Nothing Ineffective treatments:  None tried Associated symptoms: no chest pain   Risk factors: no recent infection   Pt complains of a bad cough   Past Medical History:  Diagnosis Date  . Hyperlipidemia   . Hypertension     There are no active problems to display for this patient.   Past Surgical History:  Procedure Laterality Date  . MOUTH SURGERY     plate in jaw       Home Medications    Prior to Admission medications   Medication Sig Start Date End Date Taking? Authorizing Provider  albuterol (PROVENTIL HFA;VENTOLIN HFA) 108 (90 Base) MCG/ACT inhaler Inhale 1-2 puffs into the lungs every 6 (six) hours as needed for wheezing or shortness of breath. 10/03/18   Elson AreasSofia,  K, PA-C  doxycycline (VIBRAMYCIN) 100 MG capsule Take 1 capsule (100 mg total) by mouth 2 (two) times daily. 10/03/18   Elson AreasSofia,  K, PA-C    Family History History reviewed. No pertinent family history.  Social History Social History   Tobacco Use  . Smoking status: Never Smoker  . Smokeless tobacco: Never Used  Substance Use Topics  . Alcohol use: Not Currently  . Drug use: Not Currently     Allergies   Patient has no known allergies.   Review of Systems Review of Systems  Respiratory: Positive for cough.   Cardiovascular: Negative for chest pain.  All other systems reviewed and are negative.    Physical Exam Triage Vital Signs ED Triage Vitals  Enc Vitals Group    BP 10/03/18 1026 120/82     Pulse Rate 10/03/18 1026 95     Resp 10/03/18 1026 18     Temp 10/03/18 1026 98.6 F (37 C)     Temp Source 10/03/18 1026 Oral     SpO2 10/03/18 1026 96 %     Weight 10/03/18 1027 253 lb (114.8 kg)     Height 10/03/18 1027 5\' 9"  (1.753 m)     Head Circumference --      Peak Flow --      Pain Score 10/03/18 1026 0     Pain Loc --      Pain Edu? --      Excl. in GC? --    No data found.  Updated Vital Signs BP 120/82 (BP Location: Right Arm)   Pulse 95   Temp 98.6 F (37 C) (Oral)   Resp 18   Ht 5\' 9"  (1.753 m)   Wt 114.8 kg   SpO2 96%   BMI 37.36 kg/m   Visual Acuity Right Eye Distance:   Left Eye Distance:   Bilateral Distance:    Right Eye Near:   Left Eye Near:    Bilateral Near:     Physical Exam Vitals signs and nursing note reviewed.  Constitutional:      Appearance: He is well-developed.  HENT:     Head: Normocephalic and  atraumatic.     Right Ear: Tympanic membrane normal.     Left Ear: Tympanic membrane normal.     Nose: Nose normal.     Mouth/Throat:     Mouth: Mucous membranes are moist.  Eyes:     Conjunctiva/sclera: Conjunctivae normal.  Neck:     Musculoskeletal: Normal range of motion and neck supple.  Cardiovascular:     Rate and Rhythm: Normal rate and regular rhythm.     Heart sounds: No murmur.  Pulmonary:     Effort: Pulmonary effort is normal. No respiratory distress.     Breath sounds: Normal breath sounds.     Comments: Rhonchi  Abdominal:     Palpations: Abdomen is soft.     Tenderness: There is no abdominal tenderness.  Musculoskeletal: Normal range of motion.  Skin:    General: Skin is warm and dry.  Neurological:     General: No focal deficit present.     Mental Status: He is alert.  Psychiatric:        Mood and Affect: Mood normal.      UC Treatments / Results  Labs (all labs ordered are listed, but only abnormal results are displayed) Labs Reviewed - No data to  display  EKG None  Radiology No results found.  Procedures Procedures (including critical care time)  Medications Ordered in UC Medications - No data to display  Initial Impression / Assessment and Plan / UC Course  I have reviewed the triage vital signs and the nursing notes.  Pertinent labs & imaging results that were available during my care of the patient were reviewed by me and considered in my medical decision making (see chart for details).     MDM  Pt given rx for albuterol and doxycycline  Final Clinical Impressions(s) / UC Diagnoses   Final diagnoses:  Acute bronchitis, unspecified organism   Discharge Instructions   None    ED Prescriptions    Medication Sig Dispense Auth. Provider   doxycycline (VIBRAMYCIN) 100 MG capsule Take 1 capsule (100 mg total) by mouth 2 (two) times daily. 20 capsule ,  K, PA-C   albuterol (PROVENTIL HFA;VENTOLIN HFA) 108 (90 Base) MCG/ACT inhaler Inhale 1-2 puffs into the lungs every 6 (six) hours as needed for wheezing or shortness of breath. 1 Inhaler Elson Areas,  K, [place]C     Controlled Substance Prescriptions [place] Controlled Substance Registry consulted? Not Applicable   Elson Areas,  K, [place] 10/03/18 1211

## 2019-11-22 IMAGING — DX DG NECK SOFT TISSUE
2 series · 2 of 2 positions shown · non-contrast
Comparison: None.

CLINICAL DATA: Throat swelling for 2 weeks. Difficulty speaking. No
reported injury.

EXAM:
NECK SOFT TISSUES - 1+ VIEW

[neck lat]
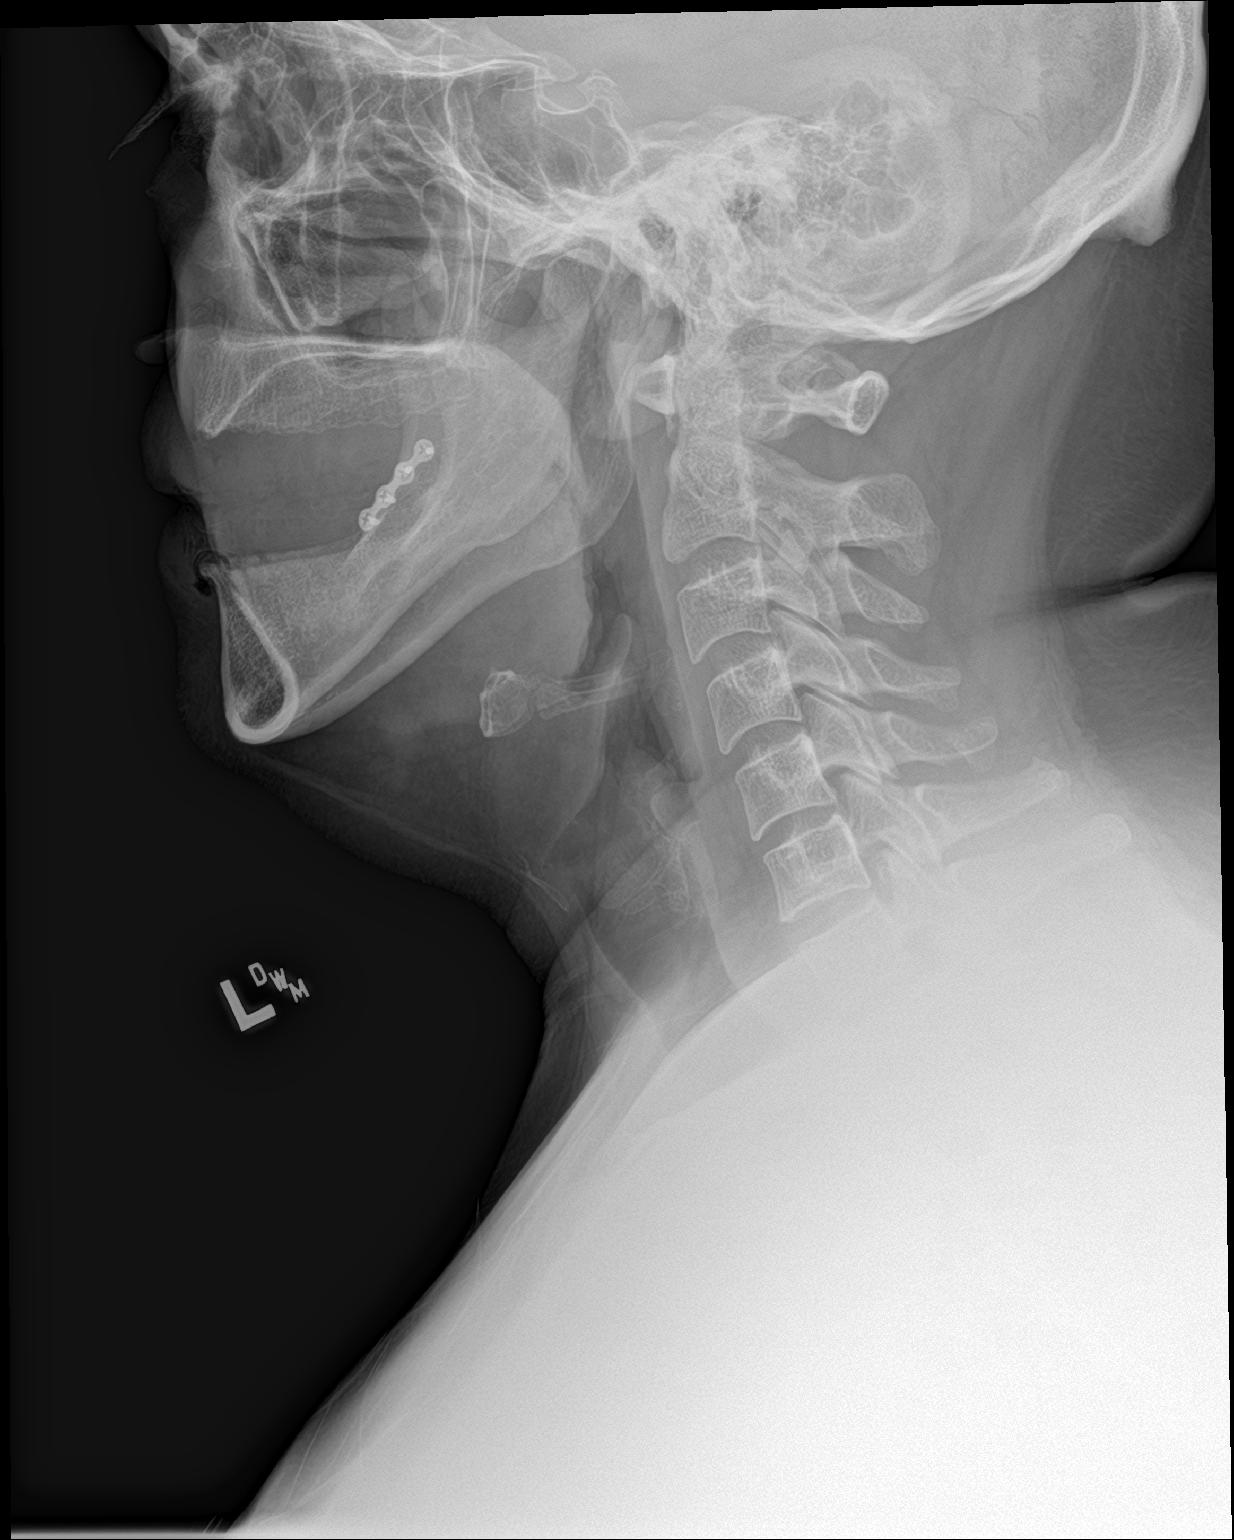

[neck ap]
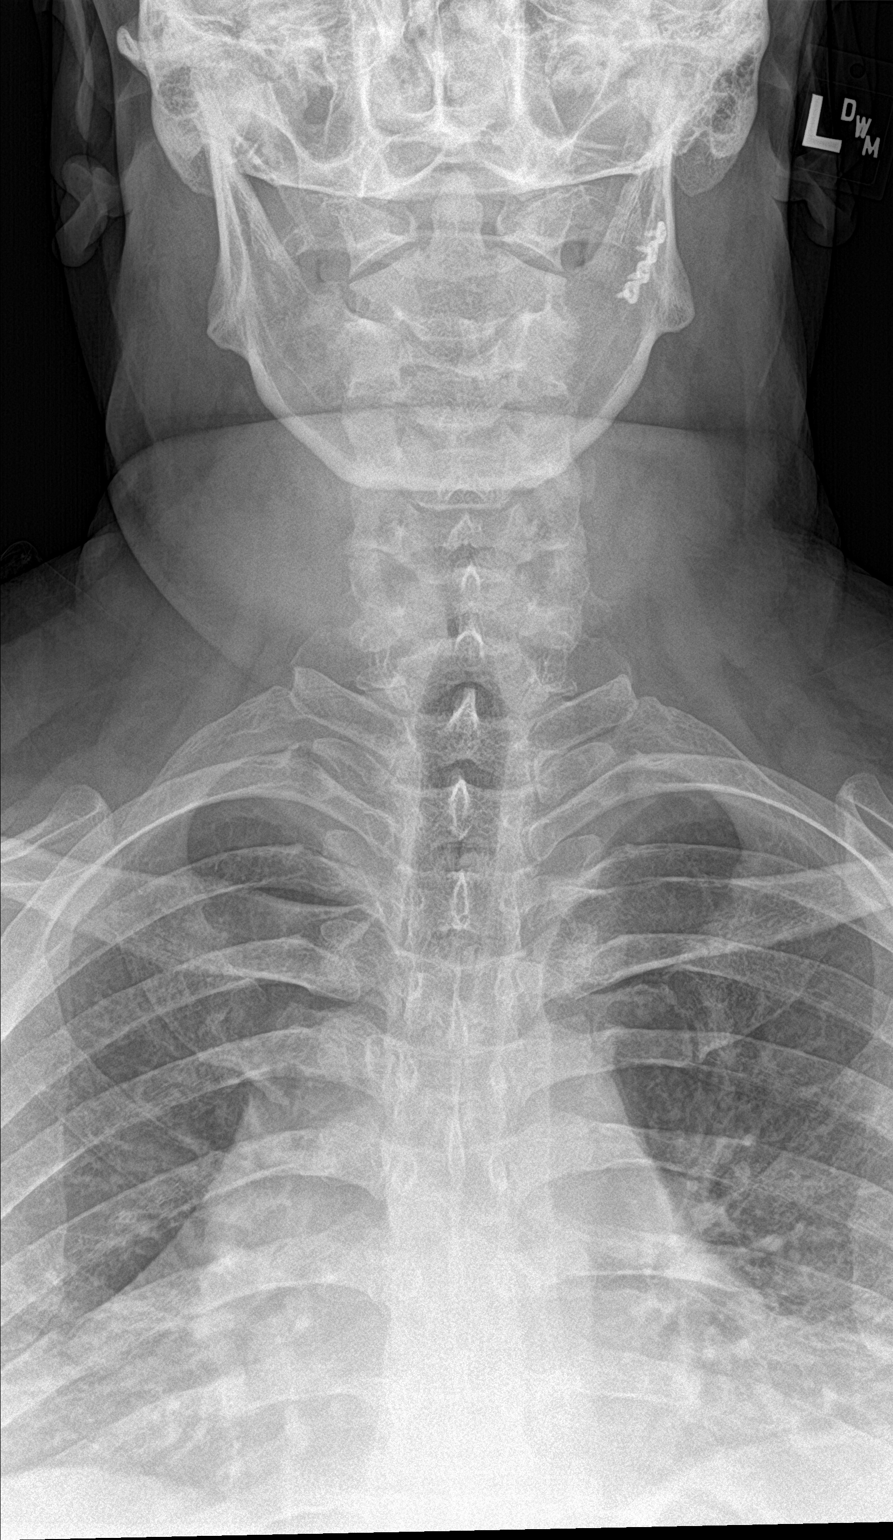

[2 of 2 positions shown; findings below may reference images not displayed]

FINDINGS: There is no evidence of retropharyngeal soft tissue swelling or
epiglottic enlargement. The cervical airway is unremarkable.
Fixation hardware overlies the left mandible. No additional
radiopaque foreign body. No definite soft tissue gas.
IMPRESSION: No acute radiographic abnormality in the neck soft tissues.

## 2020-03-15 ENCOUNTER — Emergency Department (INDEPENDENT_AMBULATORY_CARE_PROVIDER_SITE_OTHER)
Admission: RE | Admit: 2020-03-15 | Discharge: 2020-03-15 | Disposition: A | Discharge status: Home / Self Care | Payer: BC Managed Care – PPO | Source: Ambulatory Visit

## 2020-03-15 ENCOUNTER — Emergency Department (INDEPENDENT_AMBULATORY_CARE_PROVIDER_SITE_OTHER): Payer: BC Managed Care – PPO

## 2020-03-15 ENCOUNTER — Other Ambulatory Visit: Payer: Self-pay

## 2020-03-15 VITALS — BP 126/89 | HR 89 | Temp 98.5°F | Resp 20

## 2020-03-15 DIAGNOSIS — R0602 Shortness of breath: Secondary | ICD-10-CM | POA: Diagnosis not present

## 2020-03-15 DIAGNOSIS — R0989 Other specified symptoms and signs involving the circulatory and respiratory systems: Secondary | ICD-10-CM | POA: Diagnosis not present

## 2020-03-15 DIAGNOSIS — J029 Acute pharyngitis, unspecified: Secondary | ICD-10-CM | POA: Diagnosis not present

## 2020-03-15 DIAGNOSIS — J9811 Atelectasis: Secondary | ICD-10-CM | POA: Diagnosis not present

## 2020-03-15 DIAGNOSIS — Z87891 Personal history of nicotine dependence: Secondary | ICD-10-CM | POA: Diagnosis not present

## 2020-03-15 DIAGNOSIS — R9431 Abnormal electrocardiogram [ECG] [EKG]: Secondary | ICD-10-CM | POA: Diagnosis not present

## 2020-03-15 DIAGNOSIS — R05 Cough: Secondary | ICD-10-CM | POA: Diagnosis not present

## 2020-03-15 DIAGNOSIS — J209 Acute bronchitis, unspecified: Secondary | ICD-10-CM | POA: Diagnosis not present

## 2020-03-15 DIAGNOSIS — J069 Acute upper respiratory infection, unspecified: Secondary | ICD-10-CM | POA: Diagnosis not present

## 2020-03-15 DIAGNOSIS — B9789 Other viral agents as the cause of diseases classified elsewhere: Secondary | ICD-10-CM | POA: Diagnosis not present

## 2020-03-15 DIAGNOSIS — R0682 Tachypnea, not elsewhere classified: Secondary | ICD-10-CM | POA: Diagnosis not present

## 2020-03-15 HISTORY — DX: Obesity, unspecified: E66.9

## 2020-03-15 MED ORDER — DOXYCYCLINE HYCLATE 100 MG PO CAPS: 100.0000 mg | ORAL_CAPSULE | Freq: Two times a day (BID) | ORAL | 0 refills | Status: AC

## 2020-03-15 MED ORDER — PREDNISONE 50 MG PO TABS: 50.0000 mg | ORAL_TABLET | Freq: Every day | ORAL | 0 refills | Status: AC

## 2020-03-15 MED ORDER — BENZONATATE 100 MG PO CAPS: 100.0000 mg | ORAL_CAPSULE | Freq: Three times a day (TID) | ORAL | 0 refills | Status: DC

## 2020-03-15 NOTE — Discharge Instructions (Signed)
  You may take 500mg  acetaminophen every 4-6 hours or in combination with ibuprofen 400-600mg  every 6-8 hours as needed for pain, inflammation, and fever.  Be sure to well hydrated with clear liquids and get at least 8 hours of sleep at night, preferably more while sick.   Please follow up with family medicine in 1 week if needed.  Once you feel better, no fever, congestion, body aches, it would be safe, and encouraged that you receive the second dose of the Covid-19 vaccine.  If your Covid test from today comes back positive, it is recommended you wait at least 2-3 weeks from symptoms onset, however, you may want to discuss more specific timeline with your primary care provider as the duration of antibodies from an active Covid infection is still being investigated.

## 2020-03-15 NOTE — ED Triage Notes (Signed)
Patient presents to Urgent Care with complaints of cough and runny nose since about 2 days ago. Patient reports he has gotten the first covid vaccine but was not able to make the appt for his second one so is not sure now if he has to retake the series again or not. Pt has been trying NyQuil for his sx, no improvement.

## 2020-03-16 LAB — SARS-COV-2 RNA,(COVID-19) QUALITATIVE NAAT: SARS CoV2 RNA: NOT DETECTED

## 2020-09-03 ENCOUNTER — Other Ambulatory Visit: Payer: Self-pay

## 2020-09-03 ENCOUNTER — Emergency Department (INDEPENDENT_AMBULATORY_CARE_PROVIDER_SITE_OTHER)
Admission: EM | Admit: 2020-09-03 | Discharge: 2020-09-03 | Disposition: A | Discharge status: Home / Self Care | Payer: BC Managed Care – PPO | Source: Home / Self Care | Attending: Emergency Medicine | Admitting: Emergency Medicine

## 2020-09-03 ENCOUNTER — Emergency Department: Admit: 2020-09-03 | Discharge status: Absent without leave | Payer: Self-pay

## 2020-09-03 DIAGNOSIS — B349 Viral infection, unspecified: Secondary | ICD-10-CM | POA: Diagnosis not present

## 2020-09-03 DIAGNOSIS — R52 Pain, unspecified: Secondary | ICD-10-CM | POA: Diagnosis not present

## 2020-09-03 DIAGNOSIS — Z20822 Contact with and (suspected) exposure to covid-19: Secondary | ICD-10-CM | POA: Diagnosis not present

## 2020-09-03 MED ORDER — BENZONATATE 200 MG PO CAPS: 200.0000 mg | ORAL_CAPSULE | Freq: Three times a day (TID) | ORAL | 0 refills | Status: DC | PRN

## 2020-09-03 MED ORDER — IBUPROFEN 600 MG PO TABS: 600.0000 mg | ORAL_TABLET | Freq: Four times a day (QID) | ORAL | 0 refills | Status: DC | PRN

## 2020-09-03 MED ORDER — AEROCHAMBER PLUS MISC: 2 refills | Status: DC

## 2020-09-03 MED ORDER — ALBUTEROL SULFATE HFA 108 (90 BASE) MCG/ACT IN AERS: 1.0000 | INHALATION_SPRAY | RESPIRATORY_TRACT | 0 refills | Status: DC | PRN

## 2020-09-03 NOTE — Discharge Instructions (Signed)
Take 600 mg of ibuprofen combined with 1000 mg of Tylenol together 3-4 times a day as needed for body aches, headaches.  2 puffs from your albuterol inhaler using your spacer every 4-6 hours.  Tessalon for the cough.  Mucinex D, saline nasal irrigation with a Lloyd Huger med rinse and distilled water for the nasal congestion.  If you're COVID positive, get a pulse oximeter.  Go to the ER if it is below 90% consistently.  Get out and walk a little every day to keep your lungs open and help prevent a blood clot.

## 2020-09-03 NOTE — ED Triage Notes (Signed)
Patient presents to Urgent Care with complaints of sore throat, generalized body aches, nausea, intermittent fever since 2 days ago. Patient reports he has been vaccinated for covid, did have a covid exposure at work and would like to be tested.

## 2020-09-03 NOTE — ED Notes (Signed)
Test order changed from COVID + Flu to COVID only.

## 2020-09-03 NOTE — ED Provider Notes (Signed)
HPI  SUBJECTIVE:  Gregory Clark is a 45 y.o. male who presents with 2 days of sore throat, shortness of breath, nasal congestion and body aches.  He states that he has felt feverish, but has not measured his temperature.  Reports headache, rhinorrhea, sore throat, postnasal drip, loss of sense of smell and taste, cough, nausea, diarrhea.  No vomiting, abdominal pain. he has a known exposure to COVID.  He is not sure if he has been exposed to flu.  He had the second dose of the Moderna vaccine in August.  He did not get the flu vaccine.  No antipyretic in the past 6 hours.  No aggravating or alleviating factors.  He has not tried anything for this.  He has a past medical history of BMI above 30, he is a former smoker.  No history of pulmonary disease, diabetes, hypertension, chronic kidney disease, coronary disease.  PMD: None.  Past Medical History:  Diagnosis Date  . Hyperlipidemia   . Hypertension   . Obesity     Past Surgical History:  Procedure Laterality Date  . MOUTH SURGERY     plate in jaw    Family History  Problem Relation Age of Onset  . Diabetes Mother     Social History   Tobacco Use  . Smoking status: Never Smoker  . Smokeless tobacco: Never Used  Vaping Use  . Vaping Use: Never used  Substance Use Topics  . Alcohol use: Not Currently  . Drug use: Not Currently    No current facility-administered medications for this encounter.  Current Outpatient Medications:  .  albuterol (VENTOLIN HFA) 108 (90 Base) MCG/ACT inhaler, Inhale 1-2 puffs into the lungs every 4 (four) hours as needed for wheezing or shortness of breath., Disp: 1 each, Rfl: 0 .  benzonatate (TESSALON) 200 MG capsule, Take 1 capsule (200 mg total) by mouth 3 (three) times daily as needed for cough., Disp: 30 capsule, Rfl: 0 .  ibuprofen (ADVIL) 600 MG tablet, Take 1 tablet (600 mg total) by mouth every 6 (six) hours as needed., Disp: 30 tablet, Rfl: 0 .  Spacer/Aero-Holding Chambers (AEROCHAMBER  PLUS) inhaler, Use with inhaler, Disp: 1 each, Rfl: 2  No Known Allergies   ROS  As noted in HPI.   Physical Exam  BP 130/87 (BP Location: Right Arm)   Pulse (!) 114   Temp 99.6 F (37.6 C) (Oral)   Resp (!) 22   SpO2 95%   Constitutional: Well developed, well nourished, no acute distress Eyes: PERRL, EOMI, conjunctiva normal bilaterally HENT: Normocephalic, atraumatic,mucus membranes moist positive nasal congestion.  Slightly erythematous oropharynx.  Tonsils normal without exudates. Neck: Positive cervical lymphadenopathy Respiratory: Clear to auscultation bilaterally, no rales, no wheezing, no rhonchi Cardiovascular: Regular tachycardia, no murmurs, no gallops, no rubs GI: Nondistended skin: No rash, skin intact Musculoskeletal:  no deformities Neurologic: Alert & oriented x 3, CN III-XII grossly intact, no motor deficits, sensation grossly intact Psychiatric: Speech and behavior appropriate   ED Course   Medications - No data to display  Orders Placed This Encounter  Procedures  . COVID-19, Flu A+B ([facility])    Standing Status:   Standing    Number of Occurrences:   1  . Novel Coronavirus, NAA ([facility])    Standing Status:   Standing    Number of Occurrences:   1   No results found for this or any previous visit (from the past 24 hour(s)). No results found.  ED Clinical Impression  1. Viral illness   2. Generalized body aches   3. Encounter for laboratory testing for COVID-19 virus      ED Assessment/Plan  Suspect COVID.  COVID sent.  Discussed with patient will take 2 to 3 days for it to come back.  Suspect COVID more than flu.  will contact monoclonal antibody team if it is positive for consideration of monoclonal antibody or oral treatment.  By the time the test comes back.  He will be out of the window for Tamiflu so flu not sent.  Will send home with albuterol inhaler with a spacer, Tylenol/ibuprofen, Tessalon, Mucinex D, saline nasal irrigation with  a Lloyd Huger med rinse and distilled water as often as he wants, pulse oximeter, walk every day if COVID positive.  Work note.   Follow-up with [facility] primary care for routine care  Patient phone number: [phone]  Discussed labs, MDM, treatment plan, and plan for follow-up with patient Discussed sn/sx that should prompt return to the ED. patient agrees with plan.   Meds ordered this encounter  Medications  . albuterol (VENTOLIN HFA) 108 (90 Base) MCG/ACT inhaler    Sig: Inhale 1-2 puffs into the lungs every 4 (four) hours as needed for wheezing or shortness of breath.    Dispense:  1 each    Refill:  0  . Spacer/Aero-Holding Chambers (AEROCHAMBER PLUS) inhaler    Sig: Use with inhaler    Dispense:  1 each    Refill:  2    Please educate patient on use  . ibuprofen (ADVIL) 600 MG tablet    Sig: Take 1 tablet (600 mg total) by mouth every 6 (six) hours as needed.    Dispense:  30 tablet    Refill:  0  . benzonatate (TESSALON) 200 MG capsule    Sig: Take 1 capsule (200 mg total) by mouth 3 (three) times daily as needed for cough.    Dispense:  30 capsule    Refill:  0    *This clinic note was created using Scientist, clinical (histocompatibility and immunogenetics). Therefore, there may be occasional mistakes despite careful proofreading.  ?    Domenick Gong, MD 09/04/20 1059

## 2020-09-07 LAB — NOVEL CORONAVIRUS, NAA: SARS-CoV-2, NAA: DETECTED — AB

## 2020-11-12 ENCOUNTER — Other Ambulatory Visit: Payer: Self-pay

## 2020-11-12 ENCOUNTER — Emergency Department (INDEPENDENT_AMBULATORY_CARE_PROVIDER_SITE_OTHER)
Admission: EM | Admit: 2020-11-12 | Discharge: 2020-11-12 | Disposition: A | Discharge status: Home / Self Care | Payer: BC Managed Care – PPO | Source: Home / Self Care | Attending: Family Medicine | Admitting: Family Medicine

## 2020-11-12 ENCOUNTER — Emergency Department: Admit: 2020-11-12 | Discharge status: Absent without leave | Payer: Self-pay

## 2020-11-12 ENCOUNTER — Encounter: Payer: Self-pay | Admitting: Emergency Medicine

## 2020-11-12 DIAGNOSIS — H8112 Benign paroxysmal vertigo, left ear: Secondary | ICD-10-CM

## 2020-11-12 MED ORDER — MECLIZINE HCL 25 MG PO TABS: 25.0000 mg | ORAL_TABLET | Freq: Three times a day (TID) | ORAL | 0 refills | Status: DC | PRN

## 2020-11-12 NOTE — ED Notes (Signed)
Pt in waiting area. Denies cp, HA, and other acute s/s; only c/o dizziness. Informed he would be assessed when room becomes available. To notify staff of any changes.

## 2020-11-12 NOTE — ED Triage Notes (Signed)
Dizziness started today at work sitting at his computer. Intermittent now. Vaccinated

## 2020-11-12 NOTE — Discharge Instructions (Addendum)
If symptoms become significantly worse during the night or over the weekend, proceed to the local emergency room.  

## 2020-11-12 NOTE — ED Provider Notes (Signed)
[name]    CSN: [id number] Arrival date & time: 11/12/20  1029      History   Chief Complaint Chief Complaint  Patient presents with  . Dizziness    HPI Gregory Clark is a 45 y.o. male.   While sitting at his work computer today at about 10am patient suddenly felt dizzy when he turned his head.  He had nausea without vomiting that has resolved.  He states that his right ear felt clogged two days ago but he denies earache.  The symptoms have improved and vertigo symptoms occur only with movement.  He feels well otherwise without headache, weakness, paresthesias etc.  He denies nasal congestion and recent URI symptoms.  The history is provided by the patient.  Dizziness Quality:  Head spinning and imbalance Severity:  Moderate Onset quality:  Sudden Duration:  2 hours Timing:  Intermittent Progression:  Improving Chronicity:  New Context: bending over, head movement and standing up   Context: not with ear pain, not with eye movement and not with loss of consciousness   Relieved by:  Being still Worsened by:  Lying down, movement and turning head Ineffective treatments:  None tried Associated symptoms: nausea   Associated symptoms: no blood in stool, no chest pain, no headaches, no hearing loss, no syncope, no tinnitus, no vision changes, no vomiting and no weakness   Risk factors: no Meniere's disease     Past Medical History:  Diagnosis Date  . Hyperlipidemia   . Hypertension   . Obesity     There are no problems to display for this patient.   Past Surgical History:  Procedure Laterality Date  . MOUTH SURGERY     plate in jaw       Home Medications    Prior to Admission medications   Medication Sig Start Date End Date Taking? Authorizing Provider  meclizine (ANTIVERT) 25 MG tablet Take 1 tablet (25 mg total) by mouth 3 (three) times daily as needed for dizziness. 11/12/20  Yes Lattie Haw, MD  albuterol (VENTOLIN HFA) 108 (90 Base)  MCG/ACT inhaler Inhale 1-2 puffs into the lungs every 4 (four) hours as needed for wheezing or shortness of breath. 09/03/20   Domenick Gong, MD  ibuprofen (ADVIL) 600 MG tablet Take 1 tablet (600 mg total) by mouth every 6 (six) hours as needed. 09/03/20   Domenick Gong, MD  Spacer/Aero-Holding Chambers (AEROCHAMBER PLUS) inhaler Use with inhaler 09/03/20   Domenick Gong, MD    Family History Family History  Problem Relation Age of Onset  . Diabetes Mother     Social History Social History   Tobacco Use  . Smoking status: Never Smoker  . Smokeless tobacco: Never Used  Vaping Use  . Vaping Use: Never used  Substance Use Topics  . Alcohol use: Yes  . Drug use: Not Currently     Allergies   Patient has no known allergies.   Review of Systems Review of Systems  Constitutional: Negative for appetite change, chills, diaphoresis, fatigue and fever.  HENT: Negative for congestion, ear discharge, ear pain, facial swelling, hearing loss, rhinorrhea and tinnitus.   Eyes: Negative.   Respiratory: Negative.   Cardiovascular: Negative for chest pain and syncope.  Gastrointestinal: Positive for nausea. Negative for blood in stool and vomiting.  Genitourinary: Negative.   Musculoskeletal: Negative.   Skin: Negative.   Neurological: Positive for dizziness. Negative for tremors, seizures, syncope, speech difficulty, weakness, light-headedness, numbness and headaches.  Hematological: Negative for  adenopathy.     Physical Exam Triage Vital Signs ED Triage Vitals  Enc Vitals Group     BP 11/12/20 1127 128/77     Pulse Rate 11/12/20 1127 98     Resp 11/12/20 1127 20     Temp 11/12/20 1127 98.2 F (36.8 C)     Temp Source 11/12/20 1127 Oral     SpO2 11/12/20 1127 97 %     Weight 11/12/20 1134 250 lb (113.4 kg)     Height 11/12/20 1134 5\' 9"  (1.753 m)     Head Circumference --      Peak Flow --      Pain Score 11/12/20 1134 0     Pain Loc --      Pain Edu? --       Excl. in GC? --    Orthostatic VS for the past 24 hrs:  BP- Lying Pulse- Lying BP- Sitting Pulse- Sitting BP- Standing at 0 minutes Pulse- Standing at 0 minutes  11/12/20 1127 129/87 98 129/78 97 119/77 100    Updated Vital Signs BP 128/77 (BP Location: Left Arm)   Pulse 98   Temp 98.2 F (36.8 C) (Oral)   Resp 20   Ht 5\' 9"  (1.753 m)   Wt 113.4 kg   SpO2 97%   BMI 36.92 kg/m   Visual Acuity Right Eye Distance:   Left Eye Distance:   Bilateral Distance:    Right Eye Near:   Left Eye Near:    Bilateral Near:     Physical Exam Vitals and nursing note reviewed.  Constitutional:      General: He is not in acute distress.    Appearance: He is not ill-appearing.  HENT:     Head: Normocephalic.     Right Ear: Tympanic membrane, ear canal and external ear normal.     Left Ear: Tympanic membrane, ear canal and external ear normal.     Nose: Nose normal.     Mouth/Throat:     Pharynx: Oropharynx is clear.  Eyes:     Extraocular Movements: Extraocular movements intact.     Conjunctiva/sclera: Conjunctivae normal.     Pupils: Pupils are equal, round, and reactive to light.     Comments: Slight nystagmus left eye.  Fundi benign.  Cardiovascular:     Rate and Rhythm: Normal rate and regular rhythm.     Heart sounds: Normal heart sounds.  Pulmonary:     Breath sounds: Normal breath sounds.  Skin:    General: Skin is warm and dry.     Findings: No rash.  Neurological:     General: No focal deficit present.     Mental Status: He is alert and oriented to person, place, and time.     Cranial Nerves: No cranial nerve deficit.     Sensory: No sensory deficit.     Motor: No weakness.     Coordination: Coordination normal.     Gait: Gait normal.     Deep Tendon Reflexes: Reflexes normal.  Psychiatric:        Mood and Affect: Mood normal.      UC Treatments / Results  Labs (all labs ordered are listed, but only abnormal results are displayed) Labs Reviewed - No data to  display  EKG   Radiology No results found.  Procedures Procedures (including critical care time)  Medications Ordered in UC Medications - No data to display  Initial Impression / Assessment and Plan / UC  Course  I have reviewed the triage vital signs and the nursing notes.  Pertinent labs & imaging results that were available during my care of the patient were reviewed by me and considered in my medical decision making (see chart for details).    Benign exam.  Begin Antivert. Followup with ENT if not improved one week.   Final Clinical Impressions(s) / UC Diagnoses   Final diagnoses:  Benign paroxysmal positional vertigo of left ear     Discharge Instructions     If symptoms become significantly worse during the night or over the weekend, proceed to the local emergency room.    ED Prescriptions    Medication Sig Dispense Auth. Provider   meclizine (ANTIVERT) 25 MG tablet Take 1 tablet (25 mg total) by mouth 3 (three) times daily as needed for dizziness. 20 tablet Lattie Haw, MD        Lattie Haw, MD 11/14/20 [phone]

## 2021-01-30 ENCOUNTER — Emergency Department (INDEPENDENT_AMBULATORY_CARE_PROVIDER_SITE_OTHER)
Admission: EM | Admit: 2021-01-30 | Discharge: 2021-01-30 | Disposition: A | Discharge status: Home / Self Care | Payer: BC Managed Care – PPO | Source: Home / Self Care

## 2021-01-30 ENCOUNTER — Encounter: Payer: Self-pay | Admitting: Emergency Medicine

## 2021-01-30 ENCOUNTER — Other Ambulatory Visit: Payer: Self-pay

## 2021-01-30 DIAGNOSIS — R42 Dizziness and giddiness: Secondary | ICD-10-CM | POA: Diagnosis not present

## 2021-01-30 NOTE — ED Notes (Signed)
Patient is being discharged from the Urgent Care and sent to the Emergency Department via POV . Per M.Ragan, FNP, patient is in need of higher level of care due to dizziness & need for stat labs. Patient is aware and verbalizes understanding of plan of care.  Vitals:   01/30/21 1601  BP: 127/79  Pulse: 93  SpO2: 94%   Pt to [facility] ED

## 2021-01-30 NOTE — ED Provider Notes (Signed)
[name]    CSN: [id number] Arrival date & time: 01/30/21  1530      History   Chief Complaint Chief Complaint  Patient presents with   Dizziness    HPI Gregory Clark is a 45 y.o. male.   HPI 45 year old male presents with dizziness for 1 month.  Reports was evaluated here on 11/12/2020 and was treated for benign paroxysmal positional vertigo.  Patient reported meclizine provided no relief from dizziness.  Patient is currently not followed by PCP.  Past Medical History:  Diagnosis Date   Hyperlipidemia    Hypertension    Obesity     There are no problems to display for this patient.   Past Surgical History:  Procedure Laterality Date   MOUTH SURGERY     plate in jaw       Home Medications    Prior to Admission medications   Medication Sig Start Date End Date Taking? Authorizing Provider  albuterol (VENTOLIN HFA) 108 (90 Base) MCG/ACT inhaler Inhale 1-2 puffs into the lungs every 4 (four) hours as needed for wheezing or shortness of breath. 09/03/20  Yes Domenick Gong, MD  ibuprofen (ADVIL) 600 MG tablet Take 1 tablet (600 mg total) by mouth every 6 (six) hours as needed. 09/03/20  Yes Domenick Gong, MD  meclizine (ANTIVERT) 25 MG tablet Take 1 tablet (25 mg total) by mouth 3 (three) times daily as needed for dizziness. 11/12/20  Yes Lattie Haw, MD  Spacer/Aero-Holding Chambers (AEROCHAMBER PLUS) inhaler Use with inhaler 09/03/20  Yes Domenick Gong, MD    Family History Family History  Problem Relation Age of Onset   Diabetes Mother     Social History Social History   Tobacco Use   Smoking status: Never   Smokeless tobacco: Never  Vaping Use   Vaping Use: Never used  Substance Use Topics   Alcohol use: Yes   Drug use: Not Currently     Allergies   Patient has no known allergies.   Review of Systems Review of Systems  Constitutional: Negative.   HENT: Negative.    Eyes: Negative.   Respiratory: Negative.     Cardiovascular: Negative.   Gastrointestinal: Negative.   Genitourinary: Negative.   Musculoskeletal: Negative.   Skin: Negative.   Neurological:  Positive for dizziness.    Physical Exam Triage Vital Signs ED Triage Vitals  Enc Vitals Group     BP 01/30/21 1601 127/79     Pulse Rate 01/30/21 1601 93     Resp --      Temp --      Temp src --      SpO2 01/30/21 1601 94 %     Weight --      Height --      Head Circumference --      Peak Flow --      Pain Score 01/30/21 1603 0     Pain Loc --      Pain Edu? --      Excl. in GC? --    No data found.  Updated Vital Signs BP 127/79 (BP Location: Left Arm)   Pulse 93   SpO2 94%   Physical Exam Constitutional:      General: He is not in acute distress.    Appearance: Normal appearance. He is obese. He is not ill-appearing.  HENT:     Head: Normocephalic and atraumatic.     Right Ear: Tympanic membrane and ear canal normal.  Left Ear: Tympanic membrane and ear canal normal.     Nose: Nose normal.     Mouth/Throat:     Mouth: Mucous membranes are moist.     Pharynx: Oropharynx is clear.  Eyes:     Extraocular Movements: Extraocular movements intact.     Conjunctiva/sclera: Conjunctivae normal.     Pupils: Pupils are equal, round, and reactive to light.  Neck:     Comments: No JVD, no bruit Cardiovascular:     Rate and Rhythm: Normal rate and regular rhythm.     Pulses: Normal pulses.     Heart sounds: Normal heart sounds. No murmur heard. Pulmonary:     Effort: Pulmonary effort is normal. No respiratory distress.     Breath sounds: Normal breath sounds. No stridor. No wheezing, rhonchi or rales.  Musculoskeletal:        General: Normal range of motion.     Cervical back: Normal range of motion and neck supple.  Skin:    General: Skin is warm and dry.  Neurological:     General: No focal deficit present.     Mental Status: He is alert and oriented to person, place, and time.     Cranial Nerves: No cranial  nerve deficit.     Sensory: No sensory deficit.     Motor: No weakness.     Coordination: Coordination normal.     Gait: Gait normal.  Psychiatric:        Mood and Affect: Mood normal.        Behavior: Behavior normal.     UC Treatments / Results  Labs (all labs ordered are listed, but only abnormal results are displayed) Labs Reviewed - No data to display  EKG   Radiology No results found.  Procedures Procedures (including critical care time)  Medications Ordered in UC Medications - No data to display  Initial Impression / Assessment and Plan / UC Course  I have reviewed the triage vital signs and the nursing notes.  Pertinent labs & imaging results that were available during my care of the patient were reviewed by me and considered in my medical decision making (see chart for details).    MDM: 1.  Dizziness.  Patient discharged home, hemodynamically stable.  Patient advised to go to [facility] now for further evaluation of dizziness.  Patient scheduled with [facility] PCP prior to discharge. Final Clinical Impressions(s) / UC Diagnoses   Final diagnoses:  Dizziness     Discharge Instructions      Advised patient to go to [facility] now for further evaluation of dizziness     ED Prescriptions   None    PDMP not reviewed this encounter.   Trevor Iha, FNP 01/30/21 1636

## 2021-01-30 NOTE — Discharge Instructions (Addendum)
Advised patient to go to [facility] now for further evaluation of dizziness

## 2021-01-30 NOTE — ED Triage Notes (Signed)
Patient states that he is having difficulty with dizziness on and off for about 3-4 weeks.  The frequency of it is becoming more frequent.  There is nausea at times.  Denies any OTC meds for it.

## 2021-02-08 ENCOUNTER — Ambulatory Visit: Payer: BC Managed Care – PPO | Admitting: Family Medicine

## 2021-02-11 ENCOUNTER — Ambulatory Visit: Payer: BC Managed Care – PPO | Admitting: Family Medicine

## 2021-02-22 ENCOUNTER — Encounter: Payer: Self-pay | Admitting: Family Medicine

## 2021-02-22 ENCOUNTER — Ambulatory Visit (INDEPENDENT_AMBULATORY_CARE_PROVIDER_SITE_OTHER): Payer: BC Managed Care – PPO | Admitting: Family Medicine

## 2021-02-22 ENCOUNTER — Other Ambulatory Visit: Payer: Self-pay | Admitting: Family Medicine

## 2021-02-22 ENCOUNTER — Other Ambulatory Visit: Payer: Self-pay

## 2021-02-22 VITALS — BP 129/90 | HR 92 | Ht 69.0 in | Wt 290.0 lb

## 2021-02-22 DIAGNOSIS — R42 Dizziness and giddiness: Secondary | ICD-10-CM

## 2021-02-22 NOTE — Patient Instructions (Signed)
How to Perform the Epley Maneuver The Epley maneuver is an exercise that relieves symptoms of vertigo. Vertigo is the feeling that you or your surroundings are moving when they are not. When you feel vertigo, you may feel like the room is spinning and may have trouble walking. The Epley maneuver is used for a type of vertigo caused by a calcium deposit in a part of the inner ear. The maneuver involves changing headpositions to help the deposit move out of the area. You can do this maneuver at home whenever you have symptoms of vertigo. You canrepeat it in 24 hours if your vertigo has not gone away. Even though the Epley maneuver may relieve your vertigo for a few weeks, it is possible that your symptoms will return. This maneuver relieves vertigo, but itdoes not relieve dizziness. What are the risks? If it is done correctly, the Epley maneuver is considered safe. Sometimes it can lead to dizziness or nausea that goes away after a short time. If you develop other symptoms--such as changes in vision, weakness, or numbness--stopdoing the maneuver and call your health care provider. Supplies needed: A bed or table. A pillow. How to do the Epley maneuver     Sit on the edge of a bed or table with your back straight and your legs extended or hanging over the edge of the bed or table. Turn your head halfway toward the affected ear or side as told by your health care provider. Lie backward quickly with your head turned until you are lying flat on your back. Your head should dangle (head-hanging position). You may want to position a pillow under your shoulders. Hold this position for at least 30 seconds. If you feel dizzy or have symptoms of vertigo, continue to hold the position until the symptoms stop. Turn your head to the opposite direction until your unaffected ear is facing down. Your head should continue to dangle. Hold this position for at least 30 seconds. If you feel dizzy or have symptoms of  vertigo, continue to hold the position until the symptoms stop. Turn your whole body to the same side as your head so that you are positioned on your side. Your head will now be nearly facedown and no longer needs to dangle. Hold for at least 30 seconds. If you feel dizzy or have symptoms of vertigo, continue to hold the position until the symptoms stop. Sit back up. You can repeat the maneuver in 24 hours if your vertigo does not go away. Follow these instructions at home: For 24 hours after doing the Epley maneuver: Keep your head in an upright position. When lying down to sleep or rest, keep your head raised (elevated) with two or more pillows. Avoid excessive neck movements. Activity Do not drive or use machinery if you feel dizzy. After doing the Epley maneuver, return to your normal activities as told by your health care provider. Ask your health care provider what activities are safe for you. General instructions Drink enough fluid to keep your urine pale yellow. Do not drink alcohol. Take over-the-counter and prescription medicines only as told by your health care provider. Keep all follow-up visits. This is important. Preventing vertigo symptoms Ask your health care provider if there is anything you should do at home to prevent vertigo. He or she may recommend that you: Keep your head elevated with two or more pillows while you sleep. Do not sleep on the side of your affected ear. Get up slowly from bed.   Avoid sudden movements during the day. Avoid extreme head positions or movement, such as looking up or bending over. Contact a health care provider if: Your vertigo gets worse. You have other symptoms, including: Nausea. Vomiting. Headache. Get help right away if you: Have vision changes. Have a headache or neck pain that is severe or getting worse. Cannot stop vomiting. Have new numbness or weakness in any part of your body. These symptoms may represent a serious problem  that is an emergency. Do not wait to see if the symptoms will go away. Get medical help right away. Call your local emergency services (911 in the [place] Do not drive yourself to the hospital. Summary Vertigo is the feeling that you or your surroundings are moving when they are not. The Epley maneuver is an exercise that relieves symptoms of vertigo. If the Epley maneuver is done correctly, it is considered safe. This information is not intended to replace advice given to you by your health care provider. Make sure you discuss any questions you have with your healthcare provider. Document Revised: 07/07/2020 Document Reviewed: 07/07/2020 Elsevier Patient Education  2022 Elsevier Inc.  

## 2021-02-23 LAB — COMPLETE METABOLIC PANEL WITH GFR
AG Ratio: 1.6 (calc) (ref 1.0–2.5)
ALT: 44 U/L (ref 9–46)
AST: 28 U/L (ref 10–40)
Albumin: 4.4 g/dL (ref 3.6–5.1)
Alkaline phosphatase (APISO): 120 U/L (ref 36–130)
BUN: 15 mg/dL (ref 7–25)
CO2: 26 mmol/L (ref 20–32)
Calcium: 9.5 mg/dL (ref 8.6–10.3)
Chloride: 107 mmol/L (ref 98–110)
Creat: 0.91 mg/dL (ref 0.60–1.35)
GFR, Est African American: 120 mL/min/{1.73_m2} (ref >=60)
GFR, Est Non African American: 104 mL/min/{1.73_m2} (ref >=60)
Globulin: 2.7 g/dL (calc) (ref 1.9–3.7)
Glucose, Bld: 93 mg/dL (ref 65–99)
Potassium: 4.4 mmol/L (ref 3.5–5.3)
Sodium: 141 mmol/L (ref 135–146)
Total Bilirubin: 0.4 mg/dL (ref 0.2–1.2)
Total Protein: 7.1 g/dL (ref 6.1–8.1)

## 2021-02-23 LAB — CBC WITH DIFFERENTIAL/PLATELET
Absolute Monocytes: 512 cells/uL (ref 200–950)
Basophils Absolute: 31 cells/uL (ref 0–200)
Basophils Relative: 0.5 %
Eosinophils Absolute: 79 cells/uL (ref 15–500)
Eosinophils Relative: 1.3 %
HCT: 45.8 % (ref 38.5–50.0)
Hemoglobin: 15 g/dL (ref 13.2–17.1)
Lymphs Abs: 2123 cells/uL (ref 850–3900)
MCH: 27.4 pg (ref 27.0–33.0)
MCHC: 32.8 g/dL (ref 32.0–36.0)
MCV: 83.6 fL (ref 80.0–100.0)
MPV: 10.7 fL (ref 7.5–12.5)
Monocytes Relative: 8.4 %
Neutro Abs: 3355 cells/uL (ref 1500–7800)
Neutrophils Relative %: 55 %
Platelets: 279 10*3/uL (ref 140–400)
RBC: 5.48 10*6/uL (ref 4.20–5.80)
RDW: 13 % (ref 11.0–15.0)
Total Lymphocyte: 34.8 %
WBC: 6.1 10*3/uL (ref 3.8–10.8)

## 2021-02-24 ENCOUNTER — Encounter: Payer: Self-pay | Admitting: Family Medicine

## 2021-02-24 DIAGNOSIS — R42 Dizziness and giddiness: Secondary | ICD-10-CM | POA: Insufficient documentation

## 2021-02-24 NOTE — Assessment & Plan Note (Signed)
Symptoms and exam consistent with positional vertigo. Given instructions on Epley maneuver to perform at home.  If not improving with this we discussed referral to vestibular rehab. He may continue meclizine as needed. I am checking CMP and CBC today as well to be sure there are no other metabolic causes of his symptoms.

## 2021-02-24 NOTE — Progress Notes (Signed)
Gregory Clark - 45 y.o. male MRN [id number]  Date of birth: 1976/08/07  Subjective No chief complaint on file.   HPI Gregory Clark is a 45 year old male here today for initial visit to establish care.he has been seen at urgent care x2 for dizziness.  He reports that he continues to have some dizziness although it has improved some.  He denies any associated headaches, tinnitus, ear pressure, fever, or recent illness.  He does have some mild nausea associated with his dizziness.  He reports that his symptoms do seem to improve if he eats something.  He has not really noticed any symptoms with movement of his head or postural change.  ROS:  A comprehensive ROS was completed and negative except as noted per HPI  No Known Allergies  Past Medical History:  Diagnosis Date   Hyperlipidemia    Hypertension    Obesity     Past Surgical History:  Procedure Laterality Date   MOUTH SURGERY     plate in jaw    Social History   Socioeconomic History   Marital status: Single    Spouse name: Not on file   Number of children: Not on file   Years of education: Not on file   Highest education level: Not on file  Occupational History   Not on file  Tobacco Use   Smoking status: Never   Smokeless tobacco: Never  Vaping Use   Vaping Use: Never used  Substance and Sexual Activity   Alcohol use: Yes   Drug use: Not Currently   Sexual activity: Not Currently  Other Topics Concern   Not on file  Social History Narrative   Not on file   Social Determinants of Health   Financial Resource Strain: Not on file  Food Insecurity: Not on file  Transportation Needs: Not on file  Physical Activity: Not on file  Stress: Not on file  Social Connections: Not on file    Family History  Problem Relation Age of Onset   Diabetes Mother    Liver disease Father     Health Maintenance  Topic Date Due   COVID-19 Vaccine (1) Never done   HIV Screening  Never done   Hepatitis C Screening  Never done    TETANUS/TDAP  Never done   INFLUENZA VACCINE  03/21/2021   Pneumococcal Vaccine 26-82 Years old  Aged Out   HPV VACCINES  Aged Out     ----------------------------------------------------------------------------------------------------------------------------------------------------------------------------------------------------------------- Physical Exam BP 129/90   Pulse 92   Ht 5\' 9"  (1.753 m)   Wt 290 lb (131.5 kg)   BMI 42.83 kg/m   Physical Exam Constitutional:      Appearance: Normal appearance.  HENT:     Head: Normocephalic and atraumatic.     Right Ear: Tympanic membrane normal.     Left Ear: Tympanic membrane normal.  Cardiovascular:     Rate and Rhythm: Normal rate and regular rhythm.  Pulmonary:     Effort: Pulmonary effort is normal.     Breath sounds: Normal breath sounds.  Musculoskeletal:     Cervical back: Neck supple.  Neurological:     Mental Status: He is alert.     Comments: Positive Dix-Hallpike to the left with mild rotational nystagmus.   Psychiatric:        Mood and Affect: Mood normal.        Behavior: Behavior normal.    ------------------------------------------------------------------------------------------------------------------------------------------------------------------------------------------------------------------- Assessment and Plan  Vertigo Symptoms and exam consistent with positional vertigo. Given instructions  on Epley maneuver to perform at home.  If not improving with this we discussed referral to vestibular rehab. He may continue meclizine as needed. I am checking CMP and CBC today as well to be sure there are no other metabolic causes of his symptoms.   No orders of the defined types were placed in this encounter.   No follow-ups on file.    This visit occurred during the SARS-CoV-2 public health emergency.  Safety protocols were in place, including screening questions prior to the visit, additional usage of  staff PPE, and extensive cleaning of exam room while observing appropriate contact time as indicated for disinfecting solutions.

## 2021-02-25 ENCOUNTER — Telehealth: Payer: Self-pay

## 2021-02-25 NOTE — Telephone Encounter (Signed)
Dr. Ashley Royalty completed pt's FMLA documenation.   Faxed docs to [place] @ [phone].  Contacted patient for hard copy pick-up.   Docs placed in accordian.

## 2021-03-02 ENCOUNTER — Telehealth: Payer: Self-pay

## 2021-03-02 NOTE — Telephone Encounter (Signed)
Forms completed last week.

## 2021-03-02 NOTE — Telephone Encounter (Signed)
Patient dropped FMLA paperwork off at front desk for you to fill out. Patient requested a call once paperwork is completed. Paperwork is placed in message box. tvt

## 2022-04-28 ENCOUNTER — Telehealth: Payer: Self-pay | Admitting: General Practice

## 2022-04-28 ENCOUNTER — Telehealth: Payer: BC Managed Care – PPO | Admitting: Physician Assistant

## 2022-04-28 DIAGNOSIS — J208 Acute bronchitis due to other specified organisms: Secondary | ICD-10-CM

## 2022-04-28 DIAGNOSIS — B9689 Other specified bacterial agents as the cause of diseases classified elsewhere: Secondary | ICD-10-CM | POA: Diagnosis not present

## 2022-04-28 MED ORDER — AZITHROMYCIN 250 MG PO TABS: ORAL_TABLET | ORAL | 0 refills | Status: DC

## 2022-04-28 MED ORDER — PREDNISONE 20 MG PO TABS: 20.0000 mg | ORAL_TABLET | Freq: Every day | ORAL | 0 refills | Status: DC

## 2022-04-28 MED ORDER — BENZONATATE 100 MG PO CAPS: 100.0000 mg | ORAL_CAPSULE | Freq: Three times a day (TID) | ORAL | 0 refills | Status: DC | PRN

## 2022-04-28 NOTE — Telephone Encounter (Signed)
Transition Care Management Follow-up Telephone Call Date of discharge and from where: 04/28/22 from [facility] How have you been since you were released from the hospital? Doing a little better. He did not stay long enough to get any medications at the ER.  Any questions or concerns? No  Items Reviewed: Did the pt receive and understand the discharge instructions provided? Yes  Medications obtained and verified? Yes  Other? No  Any new allergies since your discharge? No  Dietary orders reviewed? Yes Do you have support at home? Yes   Home Care and Equipment/Supplies: Were home health services ordered? no  Functional Questionnaire: (I = Independent and D = Dependent) ADLs: I  Bathing/Dressing- I  Meal Prep- I  Eating- I  Maintaining continence- I  Transferring/Ambulation- I  Managing Meds- I  Follow up appointments reviewed:  PCP Hospital f/u appt confirmed? Yes  Scheduled to see Dr. Ashley Royalty on 05/03/22 @ 110. Specialist Hospital f/u appt confirmed? No  Are transportation arrangements needed? No  If their condition worsens, is the pt aware to call PCP or go to the Emergency Dept.? Yes Was the patient provided with contact information for the PCP's office or ED? Yes Was to pt encouraged to call back with questions or concerns? Yes

## 2022-04-28 NOTE — Progress Notes (Signed)
We are sorry that you are not feeling well.  Here is how we plan to help!  Based on your presentation I believe you most likely have A cough due to bacteria.  When patients have a fever and a productive cough with a change in color or increased sputum production, we are concerned about bacterial bronchitis.  If left untreated it can progress to pneumonia.  If your symptoms do not improve with your treatment plan it is important that you contact your provider.   I have prescribed Azithromyin 250 mg: two tablets now and then one tablet daily for 4 additonal days    In addition you may use A non-prescription cough medication called Mucinex DM: take 2 tablets every 12 hours. and A prescription cough medication called Tessalon Perles 100mg . You may take 1-2 capsules every 8 hours as needed for your cough.  Prednisone 20mg  Take 1 tablet daily for 5 days.  From your responses in the eVisit questionnaire you describe inflammation in the upper respiratory tract which is causing a significant cough.  This is commonly called Bronchitis and has four common causes:   Allergies Viral Infections Acid Reflux Bacterial Infection Allergies, viruses and acid reflux are treated by controlling symptoms or eliminating the cause. An example might be a cough caused by taking certain blood pressure medications. You stop the cough by changing the medication. Another example might be a cough caused by acid reflux. Controlling the reflux helps control the cough.  USE OF BRONCHODILATOR ("RESCUE") INHALERS: There is a risk from using your bronchodilator too frequently.  The risk is that over-reliance on a medication which only relaxes the muscles surrounding the breathing tubes can reduce the effectiveness of medications prescribed to reduce swelling and congestion of the tubes themselves.  Although you feel brief relief from the bronchodilator inhaler, your asthma may actually be worsening with the tubes becoming more swollen  and filled with mucus.  This can delay other crucial treatments, such as oral steroid medications. If you need to use a bronchodilator inhaler daily, several times per day, you should discuss this with your provider.  There are probably better treatments that could be used to keep your asthma under control.     HOME CARE Only take medications as instructed by your medical team. Complete the entire course of an antibiotic. Drink plenty of fluids and get plenty of rest. Avoid close contacts especially the very young and the elderly Cover your mouth if you cough or cough into your sleeve. Always remember to wash your hands A steam or ultrasonic humidifier can help congestion.   GET HELP RIGHT AWAY IF: You develop worsening fever. You become short of breath You cough up blood. Your symptoms persist after you have completed your treatment plan MAKE SURE YOU  Understand these instructions. Will watch your condition. Will get help right away if you are not doing well or get worse.    Thank you for choosing an e-visit.  Your e-visit answers were reviewed by a board certified advanced clinical practitioner to complete your personal care plan. Depending upon the condition, your plan could have included both over the counter or prescription medications.  Please review your pharmacy choice. Make sure the pharmacy is open so you can pick up prescription now. If there is a problem, you may contact your provider through and have the prescription routed to another pharmacy.  Your safety is important to . If you have drug allergies check your prescription carefully.  For the next 24 hours you can use MyChart to ask questions about today's visit, request a non-urgent call back, or ask for a work or school excuse. You will get an email in the next two days asking about your experience. I hope that your e-visit has been valuable and will speed your recovery.  I provided 5 minutes of  non face-to-face time during this encounter for chart review and documentation.

## 2022-05-03 ENCOUNTER — Encounter: Payer: Self-pay | Admitting: Family Medicine

## 2022-05-03 ENCOUNTER — Telehealth (INDEPENDENT_AMBULATORY_CARE_PROVIDER_SITE_OTHER): Payer: BC Managed Care – PPO | Admitting: Family Medicine

## 2022-05-03 DIAGNOSIS — J208 Acute bronchitis due to other specified organisms: Secondary | ICD-10-CM | POA: Diagnosis not present

## 2022-05-03 DIAGNOSIS — B9689 Other specified bacterial agents as the cause of diseases classified elsewhere: Secondary | ICD-10-CM

## 2022-05-03 NOTE — Assessment & Plan Note (Signed)
Improving symptoms. Continue to push fluids.  May use tessalon as needed.  He will let me know if he needs more of this.  Instructed to contact clinic if symptoms are worsening again.

## 2022-05-03 NOTE — Progress Notes (Signed)
Gregory Clark - 46 y.o. male MRN [id number]  Date of birth: 1976-06-02   This visit type was conducted due to national recommendations for restrictions regarding the COVID-19 Pandemic (e.g. social distancing).  This format is felt to be most appropriate for this patient at this time.  All issues noted in this document were discussed and addressed.  No physical exam was performed (except for noted visual exam findings with Video Visits).  I discussed the limitations of evaluation and management by telemedicine and the availability of in person appointments. The patient expressed understanding and agreed to proceed.  I connected withNAME@ on 05/03/22 at  1:10 PM EDT by a video enabled telemedicine application and verified that I am speaking with the correct person using two identifiers.  Present at visit: Gregory Coombe, DO Gregory Clark   Patient Location: Home [address] [place] [address]   Provider location:   [facility]  Chief Complaint  Patient presents with   Follow-up    HPI  Gregory Clark is a 46 y.o. male who presents via audio/video conferencing for a telehealth visit today.  Following up for recent ED visit.  He was having increased shortness of breath, cough, and chest congestion.  Had chest xray in the ED and respiratory panel.  Bibasilar atelectasis noted on xray.  Negative for COVID, flu, RSV.  Eventually left without being seen.  Had E-visit and was prescribed z-pack, prednisone and inhaler.  Reports symptoms seem better.  Still with mild cough.  Denies shortness of breath, fever, chills. Tessalon does help with cough.     ROS:  A comprehensive ROS was completed and negative except as noted per HPI  Past Medical History:  Diagnosis Date   Hyperlipidemia    Hypertension    Obesity     Past Surgical History:  Procedure Laterality Date   MOUTH SURGERY     plate in jaw    Family History  Problem Relation Age of Onset   Diabetes Mother    Liver disease Father      Social History   Socioeconomic History   Marital status: Single    Spouse name: Not on file   Number of children: Not on file   Years of education: Not on file   Highest education level: Not on file  Occupational History   Not on file  Tobacco Use   Smoking status: Never   Smokeless tobacco: Never  Vaping Use   Vaping Use: Never used  Substance and Sexual Activity   Alcohol use: Yes   Drug use: Not Currently   Sexual activity: Not Currently  Other Topics Concern   Not on file  Social History Narrative   Not on file   Social Determinants of Health   Financial Resource Strain: Not on file  Food Insecurity: Not on file  Transportation Needs: Not on file  Physical Activity: Not on file  Stress: Not on file  Social Connections: Not on file  Intimate Partner Violence: Not on file     Current Outpatient Medications:    benzonatate (TESSALON) 100 MG capsule, Take 1 capsule (100 mg total) by mouth 3 (three) times daily as needed., Disp: 30 capsule, Rfl: 0  EXAM:  VITALS per patient if applicable: Ht 5\' 9"  (1.753 m)   Wt 290 lb (131.5 kg)   BMI 42.83 kg/m   GENERAL: alert, oriented, appears well and in no acute distress  HEENT: atraumatic, conjunttiva clear, no obvious abnormalities on inspection of external nose  and ears  NECK: normal movements of the head and neck  LUNGS: on inspection no signs of respiratory distress, breathing rate appears normal, no obvious gross SOB, gasping or wheezing  CV: no obvious cyanosis  MS: moves all visible extremities without noticeable abnormality  PSYCH/NEURO: pleasant and cooperative, no obvious depression or anxiety, speech and thought processing grossly intact  ASSESSMENT AND PLAN:  Discussed the following assessment and plan:  Acute bacterial bronchitis Improving symptoms. Continue to push fluids.  May use tessalon as needed.  He will let me know if he needs more of this.  Instructed to contact clinic if symptoms  are worsening again.       I discussed the assessment and treatment plan with the patient. The patient was provided an opportunity to ask questions and all were answered. The patient agreed with the plan and demonstrated an understanding of the instructions.   The patient was advised to call back or seek an in-person evaluation if the symptoms worsen or if the condition fails to improve as anticipated.    Gregory Coombe, DO

## 2022-05-03 NOTE — Progress Notes (Signed)
Chest congestion - slowing resolving Cough - still present

## 2022-05-30 ENCOUNTER — Ambulatory Visit
Admission: EM | Admit: 2022-05-30 | Discharge: 2022-05-30 | Disposition: A | Discharge status: Home / Self Care | Payer: BC Managed Care – PPO | Attending: Physician Assistant | Admitting: Physician Assistant

## 2022-05-30 DIAGNOSIS — Z20822 Contact with and (suspected) exposure to covid-19: Secondary | ICD-10-CM | POA: Insufficient documentation

## 2022-05-30 DIAGNOSIS — J069 Acute upper respiratory infection, unspecified: Secondary | ICD-10-CM | POA: Insufficient documentation

## 2022-05-30 DIAGNOSIS — R059 Cough, unspecified: Secondary | ICD-10-CM | POA: Insufficient documentation

## 2022-05-30 LAB — POC SARS CORONAVIRUS 2 AG -  ED: SARS Coronavirus 2 Ag: NEGATIVE

## 2022-05-30 NOTE — ED Provider Notes (Signed)
[name]    CSN: [id number] Arrival date & time: 05/30/22  0844      History   Chief Complaint Chief Complaint  Patient presents with   Cough   Covid Exposure    HPI Gregory Clark is a 46 y.o. male.   Patient reports he has had a cough for the past 2 weeks.  Patient reports he is feeling better.  Patient reports his son had COVID.  Patient has had a negative COVID test at home.  His lawyer advised him that he needs a COVID test for work.  Patient denies any shortness of breath he does not have any asthma or breathing problems.  Patient denies any fever or chills  The history is provided by the patient. No language interpreter was used.  Cough   Past Medical History:  Diagnosis Date   Hyperlipidemia    Hypertension    Obesity     Patient Active Problem List   Diagnosis Date Noted   Acute bacterial bronchitis 05/03/2022   Vertigo 02/24/2021   Mixed hyperlipidemia 05/14/2018   Generalized anxiety disorder 05/09/2018   Seasonal allergic rhinitis 10/16/2017    Past Surgical History:  Procedure Laterality Date   MOUTH SURGERY     plate in jaw       Home Medications    Prior to Admission medications   Medication Sig Start Date End Date Taking? Authorizing Provider  benzonatate (TESSALON) 100 MG capsule Take 1 capsule (100 mg total) by mouth 3 (three) times daily as needed. 04/28/22   Mar Daring, PA-C    Family History Family History  Problem Relation Age of Onset   Diabetes Mother    Liver disease Father     Social History Social History   Tobacco Use   Smoking status: Never   Smokeless tobacco: Never  Vaping Use   Vaping Use: Never used  Substance Use Topics   Alcohol use: Not Currently   Drug use: Not Currently     Allergies   Patient has no known allergies.   Review of Systems Review of Systems  Respiratory:  Positive for cough.   All other systems reviewed and are negative.    Physical Exam Triage Vital  Signs ED Triage Vitals  Enc Vitals Group     BP 05/30/22 0903 130/86     Pulse Rate 05/30/22 0903 95     Resp 05/30/22 0903 20     Temp 05/30/22 0903 99.3 F (37.4 C)     Temp src --      SpO2 05/30/22 0903 96 %     Weight 05/30/22 0900 280 lb (127 kg)     Height 05/30/22 0900 5\' 9"  (1.753 m)     Head Circumference --      Peak Flow --      Pain Score 05/30/22 0900 0     Pain Loc --      Pain Edu? --      Excl. in [place]? --    No data found.  Updated Vital Signs BP 130/86 (BP Location: Right Arm)   Pulse 95   Temp 99.3 F (37.4 C)   Resp 20   Ht 5\' 9"  (1.753 m)   Wt 127 kg   SpO2 96%   BMI 41.35 kg/m   Visual Acuity Right Eye Distance:   Left Eye Distance:   Bilateral Distance:    Right Eye Near:   Left Eye Near:    Bilateral Near:  Physical Exam Vitals and nursing note reviewed.  Constitutional:      Appearance: He is well-developed.  HENT:     Head: Normocephalic.  Cardiovascular:     Rate and Rhythm: Normal rate.  Pulmonary:     Effort: Pulmonary effort is normal.  Abdominal:     General: There is no distension.  Musculoskeletal:        General: Normal range of motion.     Cervical back: Normal range of motion.  Skin:    General: Skin is warm.  Neurological:     General: No focal deficit present.     Mental Status: He is alert and oriented to person, place, and time.      UC Treatments / Results  Labs (all labs ordered are listed, but only abnormal results are displayed) Labs Reviewed  SARS CORONAVIRUS 2 (TAT 6-24 HRS)  POC SARS CORONAVIRUS 2 AG -  ED  POC SARS CORONAVIRUS 2 AG -  ED    EKG   Radiology No results found.  Procedures Procedures (including critical care time)  Medications Ordered in UC Medications - No data to display  Initial Impression / Assessment and Plan / UC Course  I have reviewed the triage vital signs and the nursing notes.  Pertinent labs & imaging results that were available during my care of the  patient were reviewed by me and considered in my medical decision making (see chart for details).  Clinical Course as of 05/30/22 1039  Tue May 30, 2022  0936 POC SARS Coronavirus 2 Ag-ED - Nasal Swab [LS]    Clinical Course User Index [LS] Elson Areas, PA-C    MDM rapid COVID is negative patient counseled on what is probably most likely viral respiratory illness he is advised to return if any problems Final Clinical Impressions(s) / UC Diagnoses   Final diagnoses:  Viral URI with cough     Discharge Instructions      YOur covid test is pending   ED Prescriptions   None    PDMP not reviewed this encounter.   Elson Areas, [place] 05/30/22 1041

## 2022-05-30 NOTE — Discharge Instructions (Signed)
YOur covid test is pending

## 2022-05-30 NOTE — ED Triage Notes (Signed)
Pt presents to Urgent Care with c/o cough x 2 weeks. Son was COVID positive last week, but pt has tested negative; needs a COVID test for work.

## 2022-05-31 ENCOUNTER — Telehealth: Payer: Self-pay | Admitting: Emergency Medicine

## 2022-05-31 LAB — SARS CORONAVIRUS 2 (TAT 6-24 HRS): SARS Coronavirus 2: NEGATIVE

## 2022-05-31 NOTE — Telephone Encounter (Signed)
LMTRC.  Advised if doing well to disregard the call, any questions or concerns, feel free to contact the office. 

## 2022-06-22 ENCOUNTER — Ambulatory Visit
Admission: EM | Admit: 2022-06-22 | Discharge: 2022-06-22 | Disposition: A | Discharge status: Home / Self Care | Payer: BC Managed Care – PPO | Attending: Family Medicine | Admitting: Family Medicine

## 2022-06-22 DIAGNOSIS — R051 Acute cough: Secondary | ICD-10-CM | POA: Diagnosis not present

## 2022-06-22 DIAGNOSIS — J069 Acute upper respiratory infection, unspecified: Secondary | ICD-10-CM

## 2022-06-22 MED ORDER — AZITHROMYCIN 250 MG PO TABS: ORAL_TABLET | ORAL | 0 refills | Status: DC

## 2022-06-22 MED ORDER — BENZONATATE 200 MG PO CAPS: 200.0000 mg | ORAL_CAPSULE | Freq: Three times a day (TID) | ORAL | 0 refills | Status: DC | PRN

## 2022-06-22 MED ORDER — PREDNISONE 20 MG PO TABS: 40.0000 mg | ORAL_TABLET | Freq: Every day | ORAL | 0 refills | Status: DC

## 2022-06-22 NOTE — Discharge Instructions (Signed)
Drink lots of water Run a humidifier if you have 1 Take the azithromycin antibiotic as prescribed Take prednisone once a day for 5 days I have refilled Tessalon Perles for the cough Call your doctor if not improving by next week

## 2022-06-22 NOTE — ED Provider Notes (Signed)
[facility]    CSN: [id number] Arrival date & time: 06/22/22  [id number]      History   Chief Complaint Chief Complaint  Patient presents with   Cough   Sore Throat    HPI Gregory Clark is a 46 y.o. male.   HPI  Concerning problems with recurring cough and congestion.  He states that he has had several office visits since the beginning of September for acute bronchitis/acute cough.  He states that he was better for a couple of weeks and now has been coughing again since Monday.  It is Thursday and he states the cough is getting worse, is keeping him awake at night, he has coughing up some phlegm and mucus.  Also has runny and stuffy nose with postnasal drip.  No fever or chills.  No headache or body ache.  He does not feel like this is COVID.  The family has had COVID within the last month.  Does not have any known underlying lung disease but states that he was a heavy smoker, and he does feel like he was left with some lung "weakness" after his COVID infection.  Does have underlying environmental allergies cording to medical record  Past Medical History:  Diagnosis Date   Hyperlipidemia    Hypertension    Obesity     Patient Active Problem List   Diagnosis Date Noted   Acute bacterial bronchitis 05/03/2022   Vertigo 02/24/2021   Mixed hyperlipidemia 05/14/2018   Generalized anxiety disorder 05/09/2018   Seasonal allergic rhinitis 10/16/2017    Past Surgical History:  Procedure Laterality Date   MOUTH SURGERY     plate in jaw       Home Medications    Prior to Admission medications   Medication Sig Start Date End Date Taking? Authorizing Provider  azithromycin (ZITHROMAX Z-PAK) 250 MG tablet Take 2 pills right away.  After this take 1 pill a day until gone 06/22/22  Yes Raylene Everts, MD  benzonatate (TESSALON) 200 MG capsule Take 1 capsule (200 mg total) by mouth 3 (three) times daily as needed for cough. 06/22/22  Yes Raylene Everts, MD   predniSONE (DELTASONE) 20 MG tablet Take 2 tablets (40 mg total) by mouth daily with breakfast. 06/22/22  Yes Raylene Everts, MD    Family History Family History  Problem Relation Age of Onset   Diabetes Mother    Liver disease Father     Social History Social History   Tobacco Use   Smoking status: Never   Smokeless tobacco: Never  Vaping Use   Vaping Use: Never used  Substance Use Topics   Alcohol use: Not Currently   Drug use: Not Currently     Allergies   Patient has no known allergies.   Review of Systems Review of Systems  See HPI Physical Exam Triage Vital Signs ED Triage Vitals  Enc Vitals Group     BP 06/22/22 0913 133/89     Pulse Rate 06/22/22 0913 (!) 105     Resp 06/22/22 0913 20     Temp 06/22/22 0913 100 F (37.8 C)     Temp Source 06/22/22 0913 Oral     SpO2 06/22/22 0913 97 %     Weight 06/22/22 0908 280 lb (127 kg)     Height 06/22/22 0908 5\' 9"  (1.753 m)     Head Circumference --      Peak Flow --      Pain Score  06/22/22 0907 6     Pain Loc --      Pain Edu? --      Excl. in [place]? --    No data found.  Updated Vital Signs BP 133/89 (BP Location: Right Arm)   Pulse (!) 105   Temp 100 F (37.8 C) (Oral)   Resp 20   Ht 5\' 9"  (1.753 m)   Wt 127 kg   SpO2 97%   BMI 41.35 kg/m      Physical Exam Constitutional:      General: He is not in acute distress.    Appearance: He is well-developed. He is obese. He is ill-appearing.  HENT:     Head: Normocephalic and atraumatic.     Right Ear: Tympanic membrane and ear canal normal.     Left Ear: Tympanic membrane and ear canal normal.     Nose: No congestion or rhinorrhea.     Mouth/Throat:     Mouth: Mucous membranes are moist.     Pharynx: No posterior oropharyngeal erythema.  Eyes:     Conjunctiva/sclera: Conjunctivae normal.     Pupils: Pupils are equal, round, and reactive to light.  Cardiovascular:     Rate and Rhythm: Normal rate.     Heart sounds: Normal heart sounds.   Pulmonary:     Effort: Pulmonary effort is normal. No respiratory distress.     Breath sounds: Wheezing and rhonchi present.  Abdominal:     General: There is no distension.     Palpations: Abdomen is soft.  Musculoskeletal:        General: Normal range of motion.     Cervical back: Normal range of motion.  Skin:    General: Skin is warm and dry.  Neurological:     Mental Status: He is alert.      UC Treatments / Results  Labs (all labs ordered are listed, but only abnormal results are displayed) Labs Reviewed - No data to display  EKG   Radiology No results found.  Procedures Procedures (including critical care time)  Medications Ordered in UC Medications - No data to display  Initial Impression / Assessment and Plan / UC Course  I have reviewed the triage vital signs and the nursing notes.  Pertinent labs & imaging results that were available during my care of the patient were reviewed by me and considered in my medical decision making (see chart for details).     Recurring cough and bronchitis for the last couple of months.  Follow-up with PCP Final Clinical Impressions(s) / UC Diagnoses   Final diagnoses:  Viral upper respiratory tract infection  Acute cough     Discharge Instructions      Drink lots of water Run a humidifier if you have 1 Take the azithromycin antibiotic as prescribed Take prednisone once a day for 5 days I have refilled Tessalon Perles for the cough Call your doctor if not improving by next week     ED Prescriptions     Medication Sig Dispense Auth. Provider   predniSONE (DELTASONE) 20 MG tablet Take 2 tablets (40 mg total) by mouth daily with breakfast. 10 tablet Raylene Everts, MD   azithromycin (ZITHROMAX Z-PAK) 250 MG tablet Take 2 pills right away.  After this take 1 pill a day until gone 6 tablet Raylene Everts, MD   benzonatate (TESSALON) 200 MG capsule Take 1 capsule (200 mg total) by mouth 3 (three) times  daily as needed for  cough. 21 capsule Raylene Everts, MD      PDMP not reviewed this encounter.   Raylene Everts, MD 06/22/22 1012

## 2022-06-22 NOTE — ED Triage Notes (Signed)
Pt presents to Urgent Care with c/o sore throat and cough x 3 days. Also reports orthopnea. Afebrile. Has not done a home COVID test.

## 2022-07-29 ENCOUNTER — Telehealth: Payer: BC Managed Care – PPO | Admitting: Nurse Practitioner

## 2022-07-29 DIAGNOSIS — J208 Acute bronchitis due to other specified organisms: Secondary | ICD-10-CM | POA: Diagnosis not present

## 2022-07-29 MED ORDER — PREDNISONE 20 MG PO TABS: 40.0000 mg | ORAL_TABLET | Freq: Every day | ORAL | 0 refills | Status: DC

## 2022-07-29 MED ORDER — ALBUTEROL SULFATE HFA 108 (90 BASE) MCG/ACT IN AERS: 2.0000 | INHALATION_SPRAY | Freq: Four times a day (QID) | RESPIRATORY_TRACT | 2 refills | Status: AC | PRN

## 2022-07-29 MED ORDER — PSEUDOEPH-BROMPHEN-DM 30-2-10 MG/5ML PO SYRP: 5.0000 mL | ORAL_SOLUTION | Freq: Four times a day (QID) | ORAL | 0 refills | Status: DC | PRN

## 2022-07-29 NOTE — Progress Notes (Signed)
We are sorry that you are not feeling well.  Here is how we plan to help!  Based on your presentation I believe you most likely have A cough due to a virus.  This is called viral bronchitis and is best treated by rest, plenty of fluids and control of the cough.  You may use Ibuprofen or Tylenol as directed to help your symptoms.     At this time you do not meet criteria to be treated with an antibiotic for the cough or your sore scratchy throat. You can use throat coat tea over the counter to help soothe your throat.  In addition you may use a prescription cough syrup, inhaler and prednisone which has been sent to the pharmacy. I recommend you follow up with your PCP for possible xray as you have been dealing with a cough for a few months on and off.    From your responses in the eVisit questionnaire you describe inflammation in the upper respiratory tract which is causing a significant cough.  This is commonly called Bronchitis and has four common causes:   Allergies Viral Infections Acid Reflux Bacterial Infection Allergies, viruses and acid reflux are treated by controlling symptoms or eliminating the cause. An example might be a cough caused by taking certain blood pressure medications. You stop the cough by changing the medication. Another example might be a cough caused by acid reflux. Controlling the reflux helps control the cough.  USE OF BRONCHODILATOR ("RESCUE") INHALERS: There is a risk from using your bronchodilator too frequently.  The risk is that over-reliance on a medication which only relaxes the muscles surrounding the breathing tubes can reduce the effectiveness of medications prescribed to reduce swelling and congestion of the tubes themselves.  Although you feel brief relief from the bronchodilator inhaler, your asthma may actually be worsening with the tubes becoming more swollen and filled with mucus.  This can delay other crucial treatments, such as oral steroid medications.  If you need to use a bronchodilator inhaler daily, several times per day, you should discuss this with your provider.  There are probably better treatments that could be used to keep your asthma under control.     HOME CARE Only take medications as instructed by your medical team. Complete the entire course of an antibiotic. Drink plenty of fluids and get plenty of rest. Avoid close contacts especially the very young and the elderly Cover your mouth if you cough or cough into your sleeve. Always remember to wash your hands A steam or ultrasonic humidifier can help congestion.   GET HELP RIGHT AWAY IF: You develop worsening fever. You become short of breath You cough up blood. Your symptoms persist after you have completed your treatment plan MAKE SURE YOU  Understand these instructions. Will watch your condition. Will get help right away if you are not doing well or get worse.    Thank you for choosing an e-visit.  Your e-visit answers were reviewed by a board certified advanced clinical practitioner to complete your personal care plan. Depending upon the condition, your plan could have included both over the counter or prescription medications.  Please review your pharmacy choice. Make sure the pharmacy is open so you can pick up prescription now. If there is a problem, you may contact your provider through [employer] and have the prescription routed to another pharmacy.  Your safety is important to [place]. If you have drug allergies check your prescription carefully.   For the next  24 hours you can use MyChart to ask questions about today's visit, request a non-urgent call back, or ask for a work or school excuse. You will get an email in the next two days asking about your experience. I hope that your e-visit has been valuable and will speed your recovery.

## 2022-07-29 NOTE — Progress Notes (Signed)
I have spent 5 minutes in review of e-visit questionnaire, review and updating patient chart, medical decision making and response to patient.  ° °Yurianna Tusing W Remmy Riffe, NP ° °  °

## 2022-08-08 ENCOUNTER — Telehealth: Payer: BC Managed Care – PPO | Admitting: Physician Assistant

## 2022-08-08 DIAGNOSIS — B9689 Other specified bacterial agents as the cause of diseases classified elsewhere: Secondary | ICD-10-CM

## 2022-08-08 DIAGNOSIS — J208 Acute bronchitis due to other specified organisms: Secondary | ICD-10-CM | POA: Diagnosis not present

## 2022-08-08 MED ORDER — PROMETHAZINE-DM 6.25-15 MG/5ML PO SYRP: 5.0000 mL | ORAL_SOLUTION | Freq: Four times a day (QID) | ORAL | 0 refills | Status: DC | PRN

## 2022-08-08 MED ORDER — DOXYCYCLINE HYCLATE 100 MG PO TABS: 100.0000 mg | ORAL_TABLET | Freq: Two times a day (BID) | ORAL | 0 refills | Status: DC

## 2022-08-08 NOTE — Progress Notes (Signed)
Virtual Visit Consent   Gregory Clark, you are scheduled for a virtual visit with a [facility] provider today. Just as with appointments in the office, your consent must be obtained to participate. Your consent will be active for this visit and any virtual visit you may have with one of our providers in the next 365 days. If you have a MyChart account, a copy of this consent can be sent to you electronically.  As this is a virtual visit, video technology does not allow for your provider to perform a traditional examination. This may limit your provider's ability to fully assess your condition. If your provider identifies any concerns that need to be evaluated in person or the need to arrange testing (such as labs, EKG, etc.), we will make arrangements to do so. Although advances in technology are sophisticated, we cannot ensure that it will always work on either your end or our end. If the connection with a video visit is poor, the visit may have to be switched to a telephone visit. With either a video or telephone visit, we are not always able to ensure that we have a secure connection.  By engaging in this virtual visit, you consent to the provision of healthcare and authorize for your insurance to be billed (if applicable) for the services provided during this visit. Depending on your insurance coverage, you may receive a charge related to this service.  I need to obtain your verbal consent now. Are you willing to proceed with your visit today? [name] has provided verbal consent on 08/08/2022 for a virtual visit (video or telephone). Gregory Clark, [place]  Date: 08/08/2022 9:29 AM  Virtual Visit via Video Note   I, Gregory Clark, connected with  Thayer Embleton  ([id number], 08-13-1976) on 08/08/22 at  9:15 AM EST by a video-enabled telemedicine application and verified that I am speaking with the correct person using two identifiers.  Location: Patient: Virtual Visit Location  Patient: Home Provider: Virtual Visit Location Provider: Home Office   I discussed the limitations of evaluation and management by telemedicine and the availability of in person appointments. The patient expressed understanding and agreed to proceed.    History of Present Illness: Gregory Clark is a 46 y.o. who identifies as a male who was assigned male at birth, and is being seen today for continued chest congestion and cough after evaluation 10 days ago where he was diagnosed with a viral bronchitis.  Was started on a short course of prednisone and a prescription cough medicine.  Notes he was unable to get the cough medicine as it was out of stock so has been using OTC cough suppressants without much success.  Took the prednisone as directed and noted an initial, substantial improvement in symptoms but without full resolution.  Has acutely worsened again over the past couple of days.  Notes cough is very persistent.  Is mostly dry but occasionally productive.  Worse at night.  Denies fever, chills or bodyaches.  Denies reflux or noted heartburn.  Has been unable to follow-up with his primary care provider.     HPI: HPI  Problems:  Patient Active Problem List   Diagnosis Date Noted   Acute bacterial bronchitis 05/03/2022   Vertigo 02/24/2021   Mixed hyperlipidemia 05/14/2018   Generalized anxiety disorder 05/09/2018   Seasonal allergic rhinitis 10/16/2017    Allergies: No Known Allergies Medications:  Current Outpatient Medications:    doxycycline (VIBRA-TABS) 100 MG tablet, Take 1 tablet (100  mg total) by mouth 2 (two) times daily., Disp: 14 tablet, Rfl: 0   promethazine-dextromethorphan (PROMETHAZINE-DM) 6.25-15 MG/5ML syrup, Take 5 mLs by mouth 4 (four) times daily as needed for cough., Disp: 118 mL, Rfl: 0   albuterol (VENTOLIN HFA) 108 (90 Base) MCG/ACT inhaler, Inhale 2 puffs into the lungs every 6 (six) hours as needed for wheezing or shortness of breath., Disp: 8 g, Rfl:  2  Observations/Objective: Patient is well-developed, well-nourished in no acute distress.  Resting comfortably at home.  Head is normocephalic, atraumatic.  No labored breathing. Speech is clear and coherent with logical content.  Patient is alert and oriented at baseline.   Assessment and Plan: 1. Acute bacterial bronchitis - doxycycline (VIBRA-TABS) 100 MG tablet; Take 1 tablet (100 mg total) by mouth 2 (two) times daily.  Dispense: 14 tablet; Refill: 0 - promethazine-dextromethorphan (PROMETHAZINE-DM) 6.25-15 MG/5ML syrup; Take 5 mLs by mouth 4 (four) times daily as needed for cough.  Dispense: 118 mL; Refill: 0  Concern giving initial improvement and now worsening of symptoms, that he has a festering bacterial infection.  He is afebrile and without shortness of breath outside of a coughing spell.  Question giving recent history of more frequent URI symptoms if he has some underlying reactive airway disease.  For now, supportive measures and OTC medications reviewed.  Will have him use the albuterol given as directed.  Rx doxycycline and Promethazine DM.  Discussed that is essential he schedules a follow-up with his primary care provider for further evaluation.  May benefit from asthma evaluation.  Follow Up Instructions: I discussed the assessment and treatment plan with the patient. The patient was provided an opportunity to ask questions and all were answered. The patient agreed with the plan and demonstrated an understanding of the instructions.  A copy of instructions were sent to the patient via MyChart unless otherwise noted below.   The patient was advised to call back or seek an in-person evaluation if the symptoms worsen or if the condition fails to improve as anticipated.  Time:  I spent 10 minutes with the patient via telehealth technology discussing the above problems/concerns.    Gregory Climes, PA-C

## 2022-08-08 NOTE — Patient Instructions (Signed)
[name], thank you for joining Piedad Climes, PA-C for today's virtual visit.  While this provider is not your primary care provider (PCP), if your PCP is located in our provider database this encounter information will be shared with them immediately following your visit.   A [facility] MyChart account gives you access to today's visit and all your visits, tests, and labs performed at [facility] " click here if you don't have a [facility] MyChart account or go to [email or web address]  Consent: (Patient) Abdulrahim Siddiqi provided verbal consent for this virtual visit at the beginning of the encounter.  Current Medications:  Current Outpatient Medications:    albuterol (VENTOLIN HFA) 108 (90 Base) MCG/ACT inhaler, Inhale 2 puffs into the lungs every 6 (six) hours as needed for wheezing or shortness of breath., Disp: 8 g, Rfl: 2   azithromycin (ZITHROMAX Z-PAK) 250 MG tablet, Take 2 pills right away.  After this take 1 pill a day until gone, Disp: 6 tablet, Rfl: 0   benzonatate (TESSALON) 200 MG capsule, Take 1 capsule (200 mg total) by mouth 3 (three) times daily as needed for cough., Disp: 21 capsule, Rfl: 0   brompheniramine-pseudoephedrine-DM 30-2-10 MG/5ML syrup, Take 5 mLs by mouth 4 (four) times daily as needed., Disp: 240 mL, Rfl: 0   predniSONE (DELTASONE) 20 MG tablet, Take 2 tablets (40 mg total) by mouth daily with breakfast., Disp: 10 tablet, Rfl: 0   Medications ordered in this encounter:  No orders of the defined types were placed in this encounter.    *If you need refills on other medications prior to your next appointment, please contact your pharmacy*  Follow-Up: Call back or seek an in-person evaluation if the symptoms worsen or if the condition fails to improve as anticipated.  [facility] [phone]  Other Instructions Take antibiotic (Doxycycline) as directed.  Increase fluids.  Get plenty of rest. Use Mucinex for  congestion. USe the prescription cough medication and your albuterol as directed.. Take a daily probiotic (I recommend Align or Culturelle, but even Activia Yogurt may be beneficial).  A humidifier placed in the bedroom may offer some relief for a dry, scratchy throat of nasal irritation.  Read information below on acute bronchitis.   It is extremely important that you schedule follow-up with your primary care provider for further evaluation of these recurring upper respiratory issues in case there is some underlying reactive airway disease, etc.  Please call them today to get that follow-up scheduled.  Do not delay care.  Acute Bronchitis Bronchitis is when the airways that extend from the windpipe into the lungs get red, puffy, and painful (inflamed). Bronchitis often causes thick spit (mucus) to develop. This leads to a cough. A cough is the most common symptom of bronchitis. In acute bronchitis, the condition usually begins suddenly and goes away over time (usually in 2 weeks). Smoking, allergies, and asthma can make bronchitis worse. Repeated episodes of bronchitis may cause more lung problems.  HOME CARE Rest. Drink enough fluids to keep your pee (urine) clear or pale yellow (unless you need to limit fluids as told by your doctor). Only take over-the-counter or prescription medicines as told by your doctor. Avoid smoking and secondhand smoke. These can make bronchitis worse. If you are a smoker, think about using nicotine gum or skin patches. Quitting smoking will help your lungs heal faster. Reduce the chance of getting bronchitis again by: Washing your hands often. Avoiding people with cold symptoms. Trying  not to touch your hands to your mouth, nose, or eyes. Follow up with your doctor as told.  GET HELP IF: Your symptoms do not improve after 1 week of treatment. Symptoms include: Cough. Fever. Coughing up thick spit. Body aches. Chest congestion. Chills. Shortness of  breath. Sore throat.  GET HELP RIGHT AWAY IF:  You have an increased fever. You have chills. You have severe shortness of breath. You have bloody thick spit (sputum). You throw up (vomit) often. You lose too much body fluid (dehydration). You have a severe headache. You faint.  MAKE SURE YOU:  Understand these instructions. Will watch your condition. Will get help right away if you are not doing well or get worse. Document Released: 01/24/2008 Document Revised: 04/09/2013 Document Reviewed: 01/28/2013 [facility] Patient Information 2015 [place], [place]. This information is not intended to replace advice given to you by your health care provider. Make sure you discuss any questions you have with your health care provider.    If you have been instructed to have an in-person evaluation today at a local Urgent Care facility, please use the link below. It will take you to a list of all of our available [facility], including address, phone number and hours of operation. Please do not delay care.  [facility]  If you or a family member do not have a primary care provider, use the link below to schedule a visit and establish care. When you choose a [facility] primary care physician or advanced practice provider, you gain a long-term partner in health. Find a Primary Care Provider  Learn more about [facility]'s in-office and virtual care options: [facility] - Get Care Now

## 2022-08-24 ENCOUNTER — Encounter: Payer: Self-pay | Admitting: Family Medicine

## 2022-08-24 ENCOUNTER — Ambulatory Visit: Payer: BC Managed Care – PPO | Admitting: Family Medicine

## 2022-08-24 VITALS — BP 121/79 | HR 104 | Ht 69.0 in | Wt 286.0 lb

## 2022-08-24 DIAGNOSIS — Z23 Encounter for immunization: Secondary | ICD-10-CM | POA: Diagnosis not present

## 2022-08-24 DIAGNOSIS — J069 Acute upper respiratory infection, unspecified: Secondary | ICD-10-CM | POA: Insufficient documentation

## 2022-08-24 NOTE — Patient Instructions (Addendum)
Add vitamin c and zinc supplement.  Vitamin d 2000IU daily.  Stay well hydrated and get plenty of sleep during the week.

## 2022-08-24 NOTE — Progress Notes (Signed)
Gregory Clark - 47 y.o. male MRN [id number]  Date of birth: 12-09-1975  Subjective Chief Complaint  Patient presents with   Cough    HPI Gregory Clark is a 47 year old male here today to discuss recurrent respiratory infections.  He just recently started school and they have had multiple respiratory infections which she has been self contracted.  He is wondering if there are any things he can do to help from getting these in the future.  Reports that symptoms from previous infection have resolved at this point.  ROS:  A comprehensive ROS was completed and negative except as noted per HPI  No Known Allergies  Past Medical History:  Diagnosis Date   Hyperlipidemia    Hypertension    Obesity     Past Surgical History:  Procedure Laterality Date   MOUTH SURGERY     plate in jaw    Social History   Socioeconomic History   Marital status: Single    Spouse name: Not on file   Number of children: Not on file   Years of education: Not on file   Highest education level: Not on file  Occupational History   Not on file  Tobacco Use   Smoking status: Never   Smokeless tobacco: Never  Vaping Use   Vaping Use: Never used  Substance and Sexual Activity   Alcohol use: Not Currently   Drug use: Not Currently   Sexual activity: Not Currently  Other Topics Concern   Not on file  Social History Narrative   Not on file   Social Determinants of Health   Financial Resource Strain: Not on file  Food Insecurity: Not on file  Transportation Needs: Not on file  Physical Activity: Not on file  Stress: Not on file  Social Connections: Not on file    Family History  Problem Relation Age of Onset   Diabetes Mother    Liver disease Father     Health Maintenance  Topic Date Due   DTaP/Tdap/Td (1 - Tdap) Never done   Hepatitis C Screening  05/04/2023 (Originally 06/30/1996)   HIV Screening  05/04/2023 (Originally 06/30/1993)   INFLUENZA VACCINE  Completed   COVID-19 Vaccine   Completed   HPV VACCINES  Aged Out     ----------------------------------------------------------------------------------------------------------------------------------------------------------------------------------------------------------------- Physical Exam BP 121/79 (BP Location: Right Arm, Patient Position: Sitting, Cuff Size: Large)   Pulse (!) 104   Ht 5\' 9"  (1.753 m)   Wt 286 lb (129.7 kg)   SpO2 98%   BMI 42.23 kg/m   Physical Exam Constitutional:      Appearance: Normal appearance.  HENT:     Head: Normocephalic and atraumatic.  Eyes:     General: No scleral icterus. Cardiovascular:     Rate and Rhythm: Normal rate and regular rhythm.  Pulmonary:     Effort: Pulmonary effort is normal.     Breath sounds: Normal breath sounds.  Neurological:     Mental Status: He is alert.  Psychiatric:        Mood and Affect: Mood normal.        Behavior: Behavior normal.     ------------------------------------------------------------------------------------------------------------------------------------------------------------------------------------------------------------------- Assessment and Plan  Recurrent URI (upper respiratory infection) We discussed frequent handwashing or hand sanitizing at home, sanitizing surfaces, supplementation of vitamin C, zinc and vitamin D.  Also recommended staying well-hydrated and getting adequate sleep to help with prevention of respiratory infections.   No orders of the defined types were placed in this encounter.   No follow-ups  on file.    This visit occurred during the SARS-CoV-2 public health emergency.  Safety protocols were in place, including screening questions prior to the visit, additional usage of staff PPE, and extensive cleaning of exam room while observing appropriate contact time as indicated for disinfecting solutions.

## 2022-08-24 NOTE — Assessment & Plan Note (Signed)
We discussed frequent handwashing or hand sanitizing at home, sanitizing surfaces, supplementation of vitamin C, zinc and vitamin D.  Also recommended staying well-hydrated and getting adequate sleep to help with prevention of respiratory infections.

## 2022-09-18 ENCOUNTER — Telehealth: Payer: BC Managed Care – PPO | Admitting: Physician Assistant

## 2022-09-18 DIAGNOSIS — J069 Acute upper respiratory infection, unspecified: Secondary | ICD-10-CM | POA: Diagnosis not present

## 2022-09-18 MED ORDER — FLUTICASONE PROPIONATE 50 MCG/ACT NA SUSP: 2.0000 | Freq: Every day | NASAL | 0 refills | Status: AC

## 2022-09-18 MED ORDER — BENZONATATE 100 MG PO CAPS: 100.0000 mg | ORAL_CAPSULE | Freq: Three times a day (TID) | ORAL | 0 refills | Status: DC | PRN

## 2022-09-18 NOTE — Patient Instructions (Signed)
Gregory Clark, thank you for joining Gregory Loveless, PA-C for today's virtual visit.  While this provider is not your primary care provider (PCP), if your PCP is located in our provider database this encounter information will be shared with them immediately following your visit.   A [facility] MyChart account gives you access to today's visit and all your visits, tests, and labs performed at [facility] " click here if you don't have a  MyChart account or go to [email or web address]  Consent: (Patient) Gregory Clark provided verbal consent for this virtual visit at the beginning of the encounter.  Current Medications:  Current Outpatient Medications:    benzonatate (TESSALON) 100 MG capsule, Take 1 capsule (100 mg total) by mouth 3 (three) times daily as needed., Disp: 30 capsule, Rfl: 0   fluticasone (FLONASE) 50 MCG/ACT nasal spray, Place 2 sprays into both nostrils daily., Disp: 16 g, Rfl: 0   albuterol (VENTOLIN HFA) 108 (90 Base) MCG/ACT inhaler, Inhale 2 puffs into the lungs every 6 (six) hours as needed for wheezing or shortness of breath., Disp: 8 g, Rfl: 2   Medications ordered in this encounter:  Meds ordered this encounter  Medications   fluticasone (FLONASE) 50 MCG/ACT nasal spray    Sig: Place 2 sprays into both nostrils daily.    Dispense:  16 g    Refill:  0    Order Specific Question:   Supervising Provider    Answer:   Merrilee Jansky [[id number]]   benzonatate (TESSALON) 100 MG capsule    Sig: Take 1 capsule (100 mg total) by mouth 3 (three) times daily as needed.    Dispense:  30 capsule    Refill:  0    Order Specific Question:   Supervising Provider    Answer:   Merrilee Jansky [id number]     *If you need refills on other medications prior to your next appointment, please contact your pharmacy*  Follow-Up: Call back or seek an in-person evaluation if the symptoms worsen or if the condition fails to improve as  anticipated.   Virtual Care [phone]  Other Instructions  Allergies, Adult An allergy is a condition in which the body's defense system (immune system) comes in contact with an allergen and reacts to it. An allergen is anything that causes an allergic reaction. Allergens cause the immune system to make proteins for fighting infections (antibodies). These antibodies cause cells to release chemicals called histamines that set off the symptoms of an allergic reaction. Allergies often affect the nasal passages (allergic rhinitis), eyes (allergic conjunctivitis), skin (atopic dermatitis), and stomach. Allergies can be mild, moderate, or severe. They cannot spread from person to person. Allergies can develop at any age and may be outgrown. What are the causes? This condition is caused by allergens. Common allergens include: Outdoor allergens, such as pollen, car fumes, and mold. Indoor allergens, such as dust, smoke, mold, and pet dander. Other allergens, such as foods, medicines, scents, insect bites or stings, and other skin irritants. What increases the risk? You are more likely to develop this condition if you have: Family members with allergies. Family members who have any condition that may be caused by allergens, such as asthma. This may make you more likely to have other allergies. What are the signs or symptoms? Symptoms of this condition depend on the severity of the allergy. Mild to moderate symptoms Runny nose, stuffy nose (nasal congestion), or sneezing. Itchy mouth, ears, or throat. A feeling  of mucus dripping down the back of your throat (postnasal drip). Sore throat. Itchy, red, watery, or puffy eyes. Skin rash, or itchy, red, swollen areas of skin (hives). Stomach cramps or bloating. Severe symptoms Severe allergies to food, medicine, or insect bites may cause anaphylaxis, which can be life-threatening. Symptoms include: A red (flushed) face. Wheezing or  coughing. Swollen lips, tongue, or mouth. Tight or swollen throat. Chest pain or tightness, or rapid heartbeat. Trouble breathing or shortness of breath. Pain in the abdomen, vomiting, or diarrhea. Dizziness or fainting. How is this diagnosed? This condition is diagnosed based on your symptoms, your family and medical history, and a physical exam. You may also have tests, including: Skin tests to see how your skin reacts to allergens that may be causing your symptoms. Tests include: Skin prick test. For this test, an allergen is introduced to your body through a small opening in the skin. Intradermal skin test. For this test, a small amount of allergen is injected under the first layer of your skin. Patch test. For this test, a small amount of allergen is placed on your skin. The area is covered and then checked after a few days. Blood tests. A challenge test. For this test, you will eat or breathe in a small amount of allergen to see if you have an allergic reaction. You may also be asked to: Keep a food diary. This is a record of all the foods, drinks, and symptoms you have in a day. Try an elimination diet. To do this: Remove certain foods from your diet. Add those foods back one by one to find out if any foods cause an allergic reaction. How is this treated?     Treatment for allergies depends on your symptoms. Treatment may include: Cold, wet cloths (cold compresses) to soothe itching and swelling. Eye drops or nasal sprays. Nasal irrigation to help clear your mucus or keep the nasal passages moist. A humidifier to add moisture to the air. Skin creams to treat rashes or itching. Oral antihistamines or other medicines to block the reaction or to treat inflammation. Diet changes to remove foods that cause allergies. Being exposed again and again to tiny amounts of allergens to help you build a defense against it (tolerance). This is called immunotherapy. Examples include: Allergy  shot. You receive an injection that contains an allergen. Sublingual immunotherapy. You take a small dose of allergen under your tongue. Emergency injection for anaphylaxis. You give yourself a shot using a syringe (auto-injector) that contains the amount of medicine you need. Your health care provider will teach you how to give yourself an injection. Follow these instructions at home: Medicines  Take or apply over-the-counter and prescription medicines only as told by your health care provider. Always carry your auto-injector pen if you are at risk of anaphylaxis. Give yourself an injection as told by your health care provider. Eating and drinking Follow instructions from your health care provider about eating or drinking restrictions. Drink enough fluid to keep your urine pale yellow. General instructions Wear a medical alert bracelet or necklace to let others know that you have had anaphylaxis before. Avoid known allergens whenever possible. Keep all follow-up visits as told by your health care provider. This is important. Contact a health care provider if: Your symptoms do not get better with treatment. Get help right away if: You have symptoms of anaphylaxis. These include: Swollen mouth, tongue, or throat. Pain or tightness in your chest. Trouble breathing or shortness of breath.  Dizziness or fainting. Severe abdominal pain, vomiting, or diarrhea. These symptoms may represent a serious problem that is an emergency. Do not wait to see if the symptoms will go away. Get medical help right away. Call your local emergency services (911 in the [place] Do not drive yourself to the hospital. Summary Take or apply over-the-counter and prescription medicines only as told by your health care provider. Avoid known allergens when possible. Always carry your auto-injector pen if you are at risk of anaphylaxis. Give yourself an injection as told by your health care provider. Wear a medical alert  bracelet or necklace to let others know that you have had anaphylaxis before. Anaphylaxis is a life-threatening emergency. Get help right away. This information is not intended to replace advice given to you by your health care provider. Make sure you discuss any questions you have with your health care provider. Document Revised: 04/05/2020 Document Reviewed: 06/18/2019 Elsevier Patient Education  [place].    If you have been instructed to have an in-person evaluation today at a local Urgent Care facility, please use the link below. It will take you to a list of all of our available [facility], including address, phone number and hours of operation. Please do not delay care.  [facility]  If you or a family member do not have a primary care provider, use the link below to schedule a visit and establish care. When you choose a [facility] primary care physician or advanced practice provider, you gain a long-term partner in health. Find a Primary Care Provider  Learn more about [facility]'s in-office and virtual care options: [facility]

## 2022-09-18 NOTE — Progress Notes (Signed)
Virtual Visit Consent   [name], you are scheduled for a virtual visit with a [facility] provider today. Just as with appointments in the office, your consent must be obtained to participate. Your consent will be active for this visit and any virtual visit you may have with one of our providers in the next 365 days. If you have a MyChart account, a copy of this consent can be sent to you electronically.  As this is a virtual visit, video technology does not allow for your provider to perform a traditional examination. This may limit your provider's ability to fully assess your condition. If your provider identifies any concerns that need to be evaluated in person or the need to arrange testing (such as labs, EKG, etc.), we will make arrangements to do so. Although advances in technology are sophisticated, we cannot ensure that it will always work on either your end or our end. If the connection with a video visit is poor, the visit may have to be switched to a telephone visit. With either a video or telephone visit, we are not always able to ensure that we have a secure connection.  By engaging in this virtual visit, you consent to the provision of healthcare and authorize for your insurance to be billed (if applicable) for the services provided during this visit. Depending on your insurance coverage, you may receive a charge related to this service.  I need to obtain your verbal consent now. Are you willing to proceed with your visit today? [name] has provided verbal consent on 09/18/2022 for a virtual visit (video or telephone). Mar Daring, PA-C  Date: 09/18/2022 11:14 AM  Virtual Visit via Video Note   I, Mar Daring, connected with  Gregory Clark  ([id number], 1976-05-18) on 09/18/22 at 11:00 AM EST by a video-enabled telemedicine application and verified that I am speaking with the correct person using two identifiers.  Location: Patient: Virtual Visit Location  Patient: Home Provider: Virtual Visit Location Provider: Home Office   I discussed the limitations of evaluation and management by telemedicine and the availability of in person appointments. The patient expressed understanding and agreed to proceed.    History of Present Illness: Gregory Clark is a 47 y.o. who identifies as a male who was assigned male at birth, and is being seen today for recurrent URI symptoms. Last seen 08/08/22 and treated with Doxycycline and Promethazine DM. Since September 2023 he has had 8 visits between virtual care, Urgent care and PCP office for similar symptoms Has been treated with Z-packs and prednisone a few times as well as the doxycycline mentioned last.  HPI: URI  This is a recurrent problem. The current episode started in the past 7 days (Thursday, 09/14/22). The problem has been gradually worsening. There has been no fever. Associated symptoms include congestion, coughing, headaches, a plugged ear sensation (initially), rhinorrhea, sinus pain (mild yesterday) and a sore throat (initially; now improved). Pertinent negatives include no chest pain, diarrhea, ear pain, nausea, vomiting or wheezing. Associated symptoms comments: Coughing to point feels like he wants to puke, post nasal drainage. He has tried steam (hot showers) for the symptoms.   Has 45 yo and 45 yo that are in elementary school.  Has albuterol and Promethazine DM cough syrup from 08/08/22 left over. Has not used.   Problems:  Patient Active Problem List   Diagnosis Date Noted   Recurrent URI (upper respiratory infection) 08/24/2022   Acute bacterial bronchitis 05/03/2022  Vertigo 02/24/2021   Mixed hyperlipidemia 05/14/2018   Generalized anxiety disorder 05/09/2018   Seasonal allergic rhinitis 10/16/2017    Allergies: No Known Allergies Medications:  Current Outpatient Medications:    albuterol (VENTOLIN HFA) 108 (90 Base) MCG/ACT inhaler, Inhale 2 puffs into the lungs every 6 (six) hours  as needed for wheezing or shortness of breath., Disp: 8 g, Rfl: 2   benzonatate (TESSALON) 100 MG capsule, Take 1 capsule (100 mg total) by mouth 3 (three) times daily as needed., Disp: 30 capsule, Rfl: 0   fluticasone (FLONASE) 50 MCG/ACT nasal spray, Place 2 sprays into both nostrils daily., Disp: 16 g, Rfl: 0  Observations/Objective: Patient is well-developed, well-nourished in no acute distress.  Resting comfortably at home.  Head is normocephalic, atraumatic.  No labored breathing.  Speech is clear and coherent with logical content.  Patient is alert and oriented at baseline.    Assessment and Plan: 1. Viral URI with cough - fluticasone (FLONASE) 50 MCG/ACT nasal spray; Place 2 sprays into both nostrils daily.  Dispense: 16 g; Refill: 0 - benzonatate (TESSALON) 100 MG capsule; Take 1 capsule (100 mg total) by mouth 3 (three) times daily as needed.  Dispense: 30 capsule; Refill: 0  - Suspect another Viral URI vs new onset environmental allergies due to recurrence - Add flonase daily - Can consider to add antihistamine like Zyrtec (Cetirizine), Claritin (Loratadine), or Allegra (Fexofenadine) - Add Mucinex (plain) - Tessalon perles added for cough - Push fluids - Seek in person evaluation if not improving or if symptoms worsen  Follow Up Instructions: I discussed the assessment and treatment plan with the patient. The patient was provided an opportunity to ask questions and all were answered. The patient agreed with the plan and demonstrated an understanding of the instructions.  A copy of instructions were sent to the patient via MyChart unless otherwise noted below.    The patient was advised to call back or seek an in-person evaluation if the symptoms worsen or if the condition fails to improve as anticipated.  Time:  I spent 12 minutes with the patient via telehealth technology discussing the above problems/concerns.    Mar Daring, PA-C

## 2022-11-02 ENCOUNTER — Telehealth: Payer: BC Managed Care – PPO | Admitting: Family Medicine

## 2022-11-02 DIAGNOSIS — R0982 Postnasal drip: Secondary | ICD-10-CM

## 2022-11-02 DIAGNOSIS — J302 Other seasonal allergic rhinitis: Secondary | ICD-10-CM

## 2022-11-02 MED ORDER — FLUTICASONE PROPIONATE 50 MCG/ACT NA SUSP: 2.0000 | Freq: Every day | NASAL | 0 refills | Status: AC

## 2022-11-02 MED ORDER — BENZONATATE 100 MG PO CAPS: 100.0000 mg | ORAL_CAPSULE | Freq: Three times a day (TID) | ORAL | 0 refills | Status: AC | PRN

## 2022-11-02 MED ORDER — LEVOCETIRIZINE DIHYDROCHLORIDE 5 MG PO TABS: 5.0000 mg | ORAL_TABLET | Freq: Every evening | ORAL | 0 refills | Status: AC

## 2022-11-02 NOTE — Progress Notes (Signed)
Virtual Visit Consent   Gregory Clark, you are scheduled for a virtual visit with a [facility] provider today. Just as with appointments in the office, your consent must be obtained to participate. Your consent will be active for this visit and any virtual visit you may have with one of our providers in the next 365 days. If you have a MyChart account, a copy of this consent can be sent to you electronically.  As this is a virtual visit, video technology does not allow for your provider to perform a traditional examination. This may limit your provider's ability to fully assess your condition. If your provider identifies any concerns that need to be evaluated in person or the need to arrange testing (such as labs, EKG, etc.), we will make arrangements to do so. Although advances in technology are sophisticated, we cannot ensure that it will always work on either your end or our end. If the connection with a video visit is poor, the visit may have to be switched to a telephone visit. With either a video or telephone visit, we are not always able to ensure that we have a secure connection.  By engaging in this virtual visit, you consent to the provision of healthcare and authorize for your insurance to be billed (if applicable) for the services provided during this visit. Depending on your insurance coverage, you may receive a charge related to this service.  I need to obtain your verbal consent now. Are you willing to proceed with your visit today? Gregory Clark has provided verbal consent on 11/02/2022 for a virtual visit (video or telephone). Gregory Mayo, NP  Date: 11/02/2022 10:33 AM  Virtual Visit via Video Note   I, Gregory Clark, connected with  Gregory Clark  ([id number], 1976-01-27) on 11/02/22 at 10:30 AM EDT by a video-enabled telemedicine application and verified that I am speaking with the correct person using two identifiers.  Location: Patient: Virtual Visit Location Patient:  Home Provider: Virtual Visit Location Provider: Home Office   I discussed the limitations of evaluation and management by telemedicine and the availability of in person appointments. The patient expressed understanding and agreed to proceed.    History of Present Illness: Gregory Clark is a 47 y.o. who identifies as a male who was assigned male at birth, and is being seen today for cough and nasal congestion.  Onset was Monday night- with sore throat and improve some with fluids and rest, then Wednesday day time developed a cough.  Associated symptoms are chills Modifying factors are nothing but warm fluids Denies chest pain, shortness of breath, fevers  Exposure to sick contacts- unknown COVID test: none   Problems:  Patient Active Problem List   Diagnosis Date Noted   Recurrent URI (upper respiratory infection) 08/24/2022   Acute bacterial bronchitis 05/03/2022   Vertigo 02/24/2021   Mixed hyperlipidemia 05/14/2018   Generalized anxiety disorder 05/09/2018   Seasonal allergic rhinitis 10/16/2017    Allergies: No Known Allergies Medications:  Current Outpatient Medications:    albuterol (VENTOLIN HFA) 108 (90 Base) MCG/ACT inhaler, Inhale 2 puffs into the lungs every 6 (six) hours as needed for wheezing or shortness of breath., Disp: 8 g, Rfl: 2   benzonatate (TESSALON) 100 MG capsule, Take 1 capsule (100 mg total) by mouth 3 (three) times daily as needed., Disp: 30 capsule, Rfl: 0   fluticasone (FLONASE) 50 MCG/ACT nasal spray, Place 2 sprays into both nostrils daily., Disp: 16 g, Rfl: 0  Observations/Objective: Patient  is well-developed, well-nourished in no acute distress.  Resting comfortably  at home.  Head is normocephalic, atraumatic.  No labored breathing.  Speech is clear and coherent with logical content.  Patient is alert and oriented at baseline.  Cough -throat clearing  Assessment and Plan:  1. Seasonal allergic rhinitis, unspecified trigger  - benzonatate  (TESSALON) 100 MG capsule; Take 1 capsule (100 mg total) by mouth 3 (three) times daily as needed.  Dispense: 30 capsule; Refill: 0 - fluticasone (FLONASE) 50 MCG/ACT nasal spray; Place 2 sprays into both nostrils daily.  Dispense: 16 g; Refill: 0 - levocetirizine (XYZAL) 5 MG tablet; Take 1 tablet (5 mg total) by mouth every evening.  Dispense: 30 tablet; Refill: 0  2. Post-nasal drip  -Take meds as prescribed -Rest -Use a cool mist humidifier especially during the winter months when heat dries out the air. - Use saline nose sprays frequently to help soothe nasal passages and promote drainage. -Saline irrigations of the nose can be very helpful if done frequently.             * 4X daily for 1 week*             * Use of a nettie pot can be helpful with this.  *Follow directions with this* *Boiled or distilled water only -stay hydrated by drinking plenty of fluids - Keep thermostat turn down low to prevent drying out sinuses - For any cough or congestion- robitussin DM or Delsym as needed - For fever or aches or pains- take tylenol or ibuprofen as directed on bottle      -work note provided  If you do not improve you will need a follow up visit in person.               Reviewed side effects, risks and benefits of medication.    Patient acknowledged agreement and understanding of the plan.   Past Medical, Surgical, Social History, Allergies, and Medications have been Reviewed.    Follow Up Instructions: I discussed the assessment and treatment plan with the patient. The patient was provided an opportunity to ask questions and all were answered. The patient agreed with the plan and demonstrated an understanding of the instructions.  A copy of instructions were sent to the patient via MyChart unless otherwise noted below.     The patient was advised to call back or seek an in-person evaluation if the symptoms worsen or if the condition fails to improve as anticipated.  Time:  I  spent 10 minutes with the patient via telehealth technology discussing the above problems/concerns.    Gregory Mayo, NP

## 2022-11-02 NOTE — Patient Instructions (Addendum)
Gregory Clark, thank you for joining Gregory Mayo, NP for today's virtual visit.  While this provider is not your primary care provider (PCP), if your PCP is located in our provider database this encounter information will be shared with them immediately following your visit.   [facility] account gives you access to today's visit and all your visits, tests, and labs performed at [facility] " click here if you don't have a [facility] account or go to [email or web address]  Consent: (Patient) Gregory Clark provided verbal consent for this virtual visit at the beginning of the encounter.  Current Medications:  Current Outpatient Medications:    fluticasone (FLONASE) 50 MCG/ACT nasal spray, Place 2 sprays into both nostrils daily., Disp: 16 g, Rfl: 0   levocetirizine (XYZAL) 5 MG tablet, Take 1 tablet (5 mg total) by mouth every evening., Disp: 30 tablet, Rfl: 0   albuterol (VENTOLIN HFA) 108 (90 Base) MCG/ACT inhaler, Inhale 2 puffs into the lungs every 6 (six) hours as needed for wheezing or shortness of breath., Disp: 8 g, Rfl: 2   benzonatate (TESSALON) 100 MG capsule, Take 1 capsule (100 mg total) by mouth 3 (three) times daily as needed., Disp: 30 capsule, Rfl: 0   fluticasone (FLONASE) 50 MCG/ACT nasal spray, Place 2 sprays into both nostrils daily., Disp: 16 g, Rfl: 0   Medications ordered in this encounter:  Meds ordered this encounter  Medications   benzonatate (TESSALON) 100 MG capsule    Sig: Take 1 capsule (100 mg total) by mouth 3 (three) times daily as needed.    Dispense:  30 capsule    Refill:  0    Order Specific Question:   Supervising Provider    Answer:   Gregory Clark [id number]   fluticasone (FLONASE) 50 MCG/ACT nasal spray    Sig: Place 2 sprays into both nostrils daily.    Dispense:  16 g    Refill:  0    Order Specific Question:   Supervising Provider    Answer:   Gregory Clark [id number]   levocetirizine (XYZAL) 5  MG tablet    Sig: Take 1 tablet (5 mg total) by mouth every evening.    Dispense:  30 tablet    Refill:  0    Order Specific Question:   Supervising Provider    Answer:   Gregory Clark [id number]     *If you need refills on other medications prior to your next appointment, please contact your pharmacy*  Follow-Up: Call back or seek an in-person evaluation if the symptoms worsen or if the condition fails to improve as anticipated.  Gregory Clark [phone]  Other Instructions  -Take meds as prescribed -Rest -Use Mucinex if chest congestion starts  -Use a cool mist humidifier especially during the winter months when heat dries out the air. - Use saline nose sprays frequently to help soothe nasal passages and promote drainage. -Saline irrigations of the nose can be very helpful if done frequently.             * 4X daily for 1 week*             * Use of a nettie pot can be helpful with this.  *Follow directions with this* *Boiled or distilled water only -stay hydrated by drinking plenty of fluids - Keep thermostat turn down low to prevent drying out sinuses - For any cough or congestion- robitussin DM or Delsym as needed -  For fever or aches or pains- take tylenol or ibuprofen as directed on bottle  If you do not improve you will need a follow up visit in person.                 If you have been instructed to have an in-person evaluation today at a local Urgent Care facility, please use the link below. It will take you to a list of all of our available [facility], including address, phone number and hours of operation. Please do not delay care.  [facility]  If you or a family member do not have a primary care provider, use the link below to schedule a visit and establish care. When you choose a [facility] primary care physician or advanced practice provider, you gain a long-term partner in health. Find a Primary Care  Provider  Learn more about [facility]'s in-office and virtual care options: [facility]
